# Patient Record
Sex: Female | Born: 1975 | Race: White | Hispanic: No | Marital: Single | Smoking: Former smoker
Health system: Southern US, Community
[De-identification: ages and names within clinical notes are randomized; demographics above are authoritative.]

## PROBLEM LIST (undated history)

## (undated) DIAGNOSIS — E042 Nontoxic multinodular goiter: Secondary | ICD-10-CM

## (undated) DIAGNOSIS — F419 Anxiety disorder, unspecified: Secondary | ICD-10-CM

## (undated) DIAGNOSIS — F988 Other specified behavioral and emotional disorders with onset usually occurring in childhood and adolescence: Secondary | ICD-10-CM

## (undated) HISTORY — DX: Nontoxic multinodular goiter: E04.2

## (undated) HISTORY — DX: Anxiety disorder, unspecified: F41.9

## (undated) HISTORY — DX: Other specified behavioral and emotional disorders with onset usually occurring in childhood and adolescence: F98.8

## (undated) HISTORY — PX: WISDOM TOOTH EXTRACTION: SHX21

---

## 1998-12-03 ENCOUNTER — Inpatient Hospital Stay (HOSPITAL_COMMUNITY): Admission: AD | Admit: 1998-12-03 | Discharge: 1998-12-03 | Payer: Self-pay | Admitting: Obstetrics and Gynecology

## 1998-12-06 ENCOUNTER — Ambulatory Visit (HOSPITAL_COMMUNITY): Admission: RE | Admit: 1998-12-06 | Discharge: 1998-12-06 | Payer: Self-pay | Admitting: Obstetrics and Gynecology

## 1998-12-12 ENCOUNTER — Ambulatory Visit (HOSPITAL_COMMUNITY): Admission: RE | Admit: 1998-12-12 | Discharge: 1998-12-12 | Payer: Self-pay | Admitting: Obstetrics and Gynecology

## 1999-01-10 ENCOUNTER — Encounter: Payer: Self-pay | Admitting: Obstetrics and Gynecology

## 1999-01-10 ENCOUNTER — Ambulatory Visit (HOSPITAL_COMMUNITY): Admission: RE | Admit: 1999-01-10 | Discharge: 1999-01-10 | Payer: Self-pay | Admitting: Obstetrics and Gynecology

## 1999-02-12 ENCOUNTER — Encounter: Payer: Self-pay | Admitting: Obstetrics and Gynecology

## 1999-02-12 ENCOUNTER — Ambulatory Visit (HOSPITAL_COMMUNITY): Admission: RE | Admit: 1999-02-12 | Discharge: 1999-02-12 | Payer: Self-pay | Admitting: Obstetrics and Gynecology

## 1999-03-08 ENCOUNTER — Inpatient Hospital Stay (HOSPITAL_COMMUNITY): Admission: AD | Admit: 1999-03-08 | Discharge: 1999-03-10 | Payer: Self-pay | Admitting: Obstetrics and Gynecology

## 1999-10-14 ENCOUNTER — Other Ambulatory Visit: Admission: RE | Admit: 1999-10-14 | Discharge: 1999-10-14 | Payer: Self-pay | Admitting: Obstetrics and Gynecology

## 2000-10-23 ENCOUNTER — Other Ambulatory Visit: Admission: RE | Admit: 2000-10-23 | Discharge: 2000-10-23 | Payer: Self-pay | Admitting: Obstetrics and Gynecology

## 2003-06-13 ENCOUNTER — Emergency Department (HOSPITAL_COMMUNITY): Admission: EM | Admit: 2003-06-13 | Discharge: 2003-06-13 | Payer: Self-pay | Admitting: Emergency Medicine

## 2003-10-26 ENCOUNTER — Other Ambulatory Visit: Admission: RE | Admit: 2003-10-26 | Discharge: 2003-10-26 | Payer: Self-pay | Admitting: Obstetrics and Gynecology

## 2005-11-07 ENCOUNTER — Other Ambulatory Visit: Admission: RE | Admit: 2005-11-07 | Discharge: 2005-11-07 | Payer: Self-pay | Admitting: Internal Medicine

## 2005-12-10 ENCOUNTER — Other Ambulatory Visit: Admission: RE | Admit: 2005-12-10 | Discharge: 2005-12-10 | Payer: Self-pay | Admitting: Obstetrics and Gynecology

## 2006-08-24 ENCOUNTER — Ambulatory Visit: Payer: Self-pay | Admitting: Family Medicine

## 2006-11-17 ENCOUNTER — Ambulatory Visit: Payer: Self-pay | Admitting: Family Medicine

## 2006-11-18 ENCOUNTER — Encounter: Admission: RE | Admit: 2006-11-18 | Discharge: 2006-11-18 | Payer: Self-pay | Admitting: Family Medicine

## 2006-12-08 HISTORY — PX: OTHER SURGICAL HISTORY: SHX169

## 2007-02-22 ENCOUNTER — Ambulatory Visit: Payer: Self-pay | Admitting: Internal Medicine

## 2007-02-25 ENCOUNTER — Ambulatory Visit: Payer: Self-pay | Admitting: Internal Medicine

## 2007-03-02 ENCOUNTER — Encounter: Payer: Self-pay | Admitting: Internal Medicine

## 2007-03-08 ENCOUNTER — Encounter: Admission: RE | Admit: 2007-03-08 | Discharge: 2007-03-08 | Payer: Self-pay | Admitting: Internal Medicine

## 2007-03-31 ENCOUNTER — Encounter: Payer: Self-pay | Admitting: Internal Medicine

## 2007-03-31 ENCOUNTER — Other Ambulatory Visit: Admission: RE | Admit: 2007-03-31 | Discharge: 2007-03-31 | Payer: Self-pay | Admitting: Interventional Radiology

## 2007-03-31 ENCOUNTER — Encounter (INDEPENDENT_AMBULATORY_CARE_PROVIDER_SITE_OTHER): Payer: Self-pay | Admitting: *Deleted

## 2007-03-31 ENCOUNTER — Encounter: Admission: RE | Admit: 2007-03-31 | Discharge: 2007-03-31 | Payer: Self-pay | Admitting: Internal Medicine

## 2007-11-17 ENCOUNTER — Telehealth (INDEPENDENT_AMBULATORY_CARE_PROVIDER_SITE_OTHER): Payer: Self-pay | Admitting: *Deleted

## 2007-11-18 ENCOUNTER — Ambulatory Visit: Payer: Self-pay | Admitting: Internal Medicine

## 2008-01-07 IMAGING — US US SOFT TISSUE HEAD/NECK
1 series · 13 of 25 positions shown · non-contrast
Comparison: none

CLINICAL DATA: Nodule noted within the left thyroid on physical examination. 
 THYROID ULTRASOUND:
TECHNIQUE: Ultrasound examination of the thyroid gland and adjacent soft tissue structures was performed.

[Series 1: unknown · 0.06mm/px · 13 of 53 slices shown]
[im 1/53]
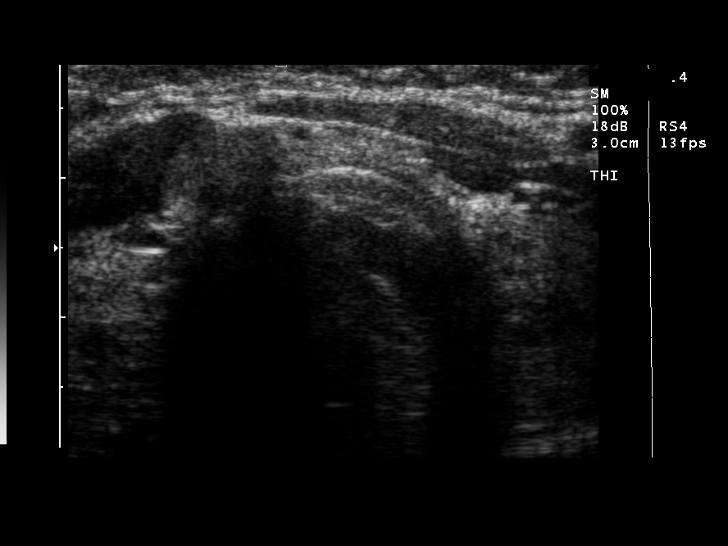
[im 5/53]
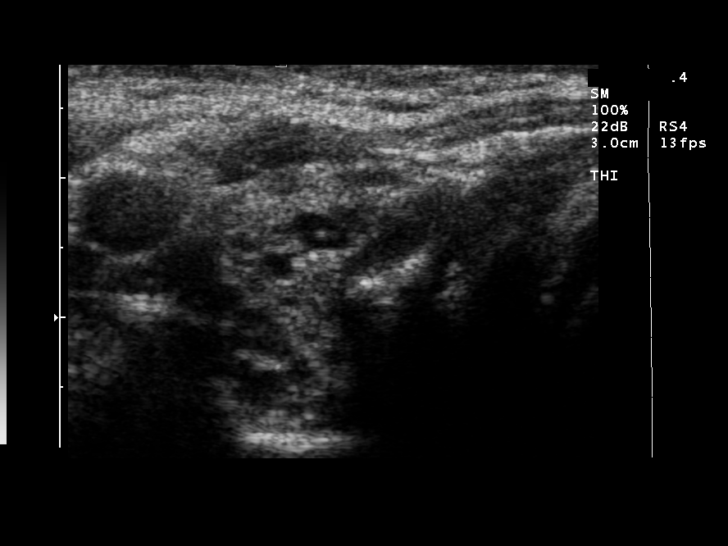
[im 9/53]
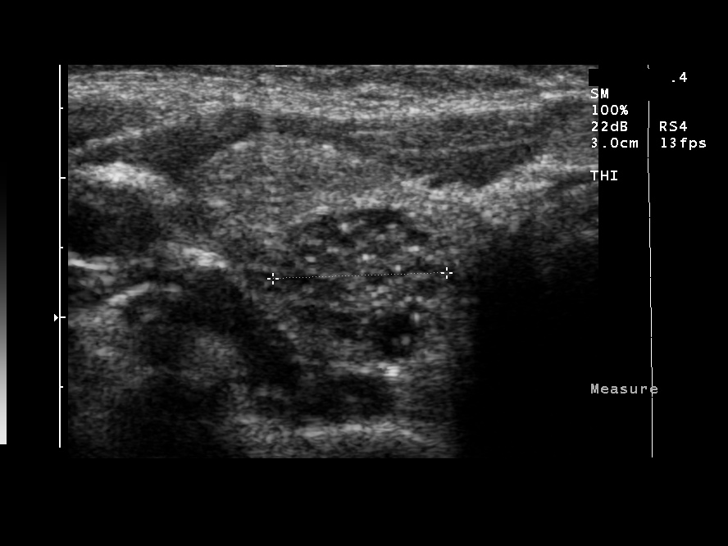
[im 14/53]
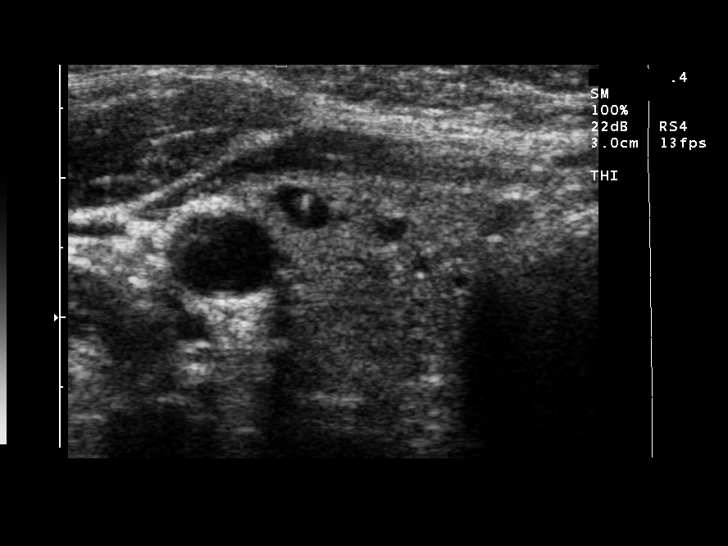
[im 18/53]
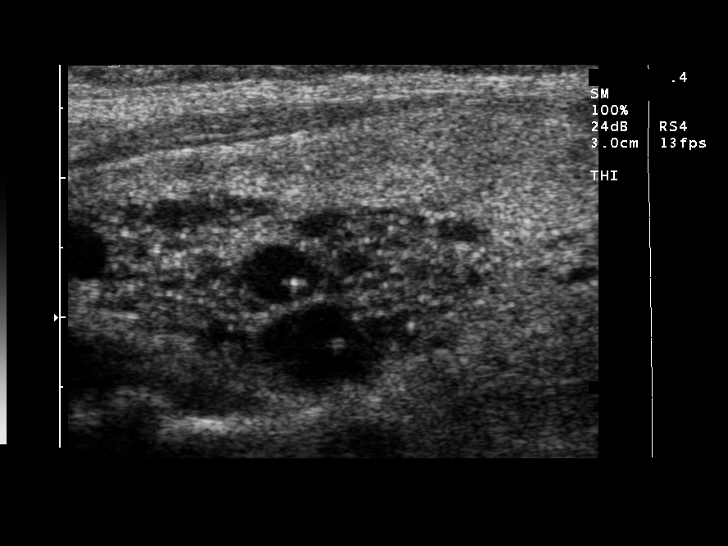
[im 22/53]
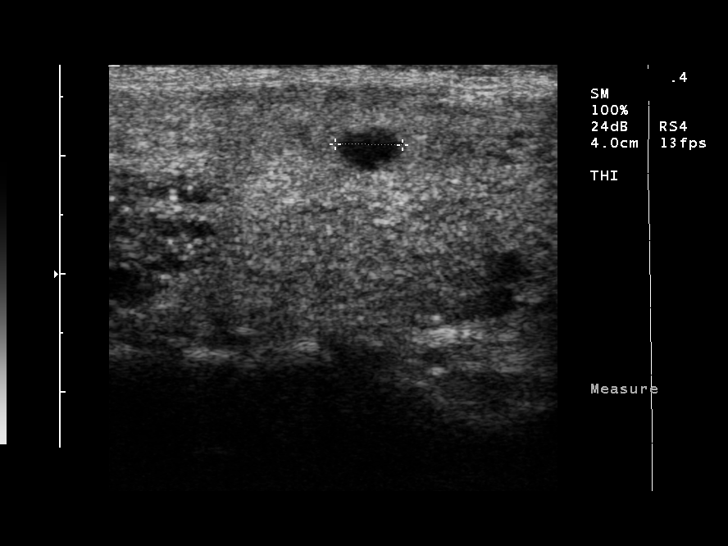
[im 27/53]
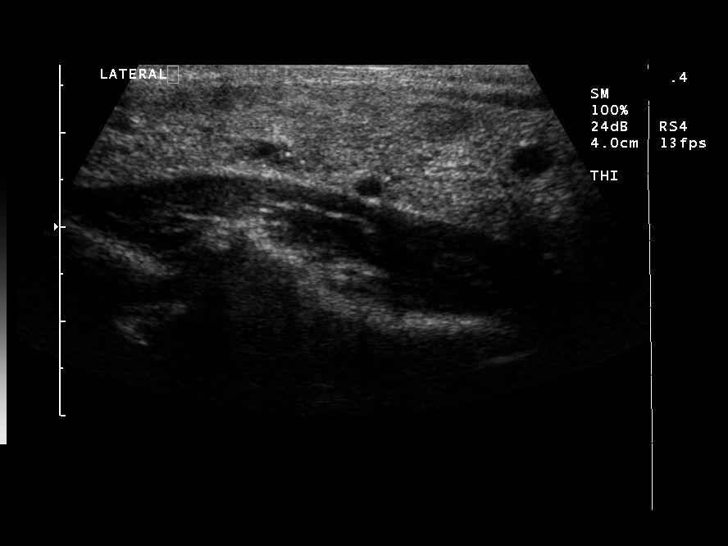
[im 31/53]
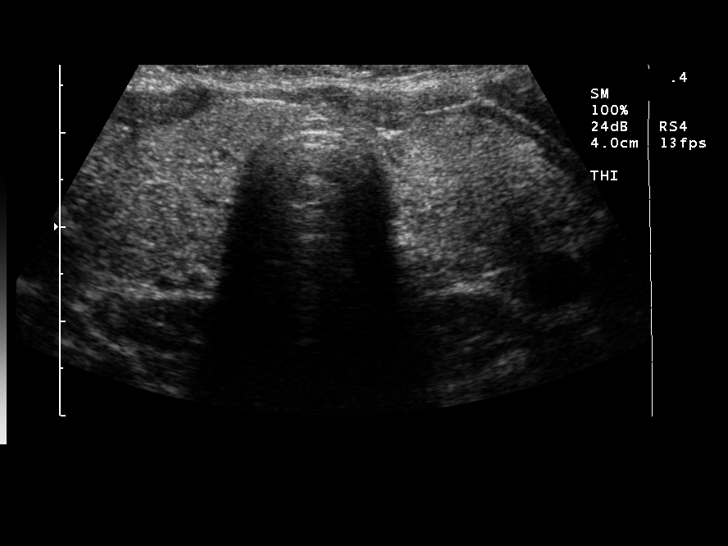
[im 35/53]
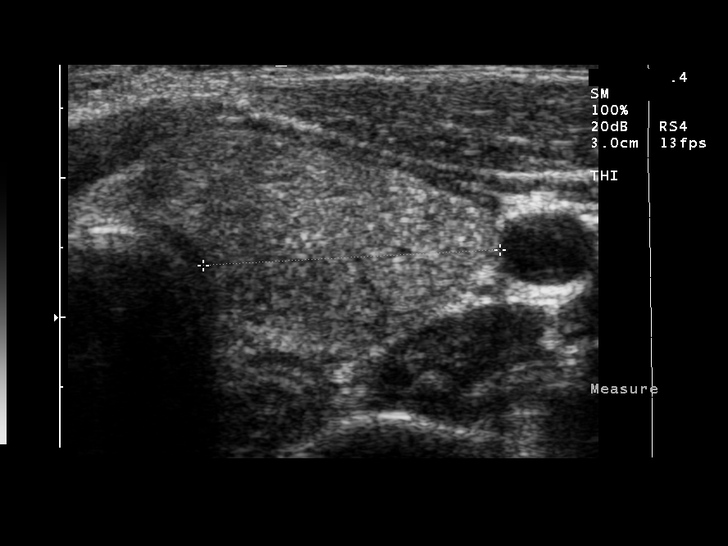
[im 40/53]
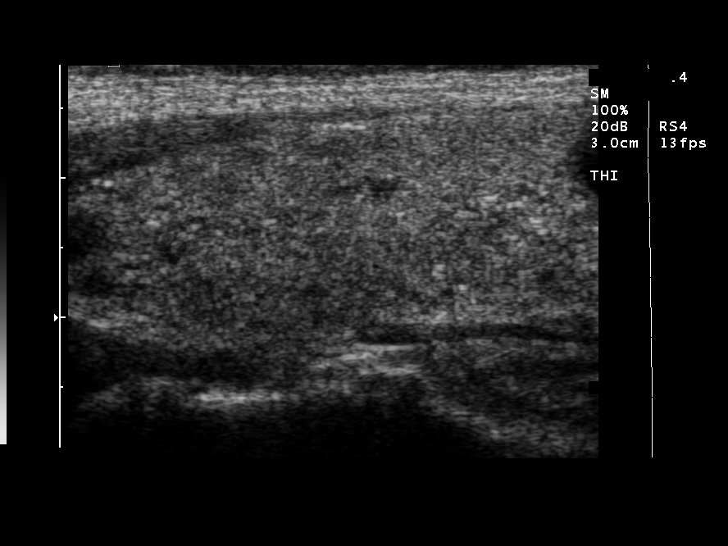
[im 44/53]
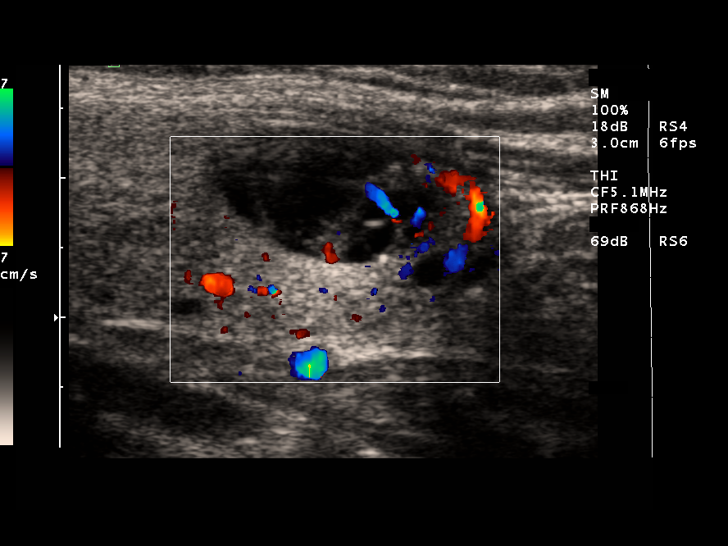
[im 48/53]
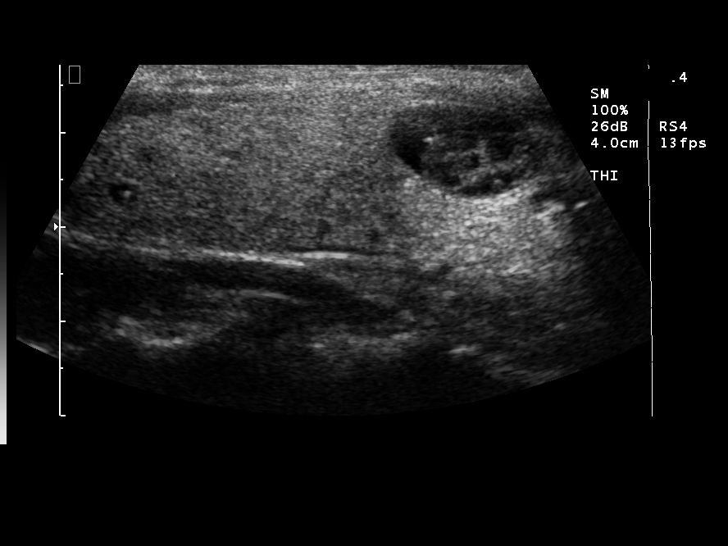
[im 53/53]
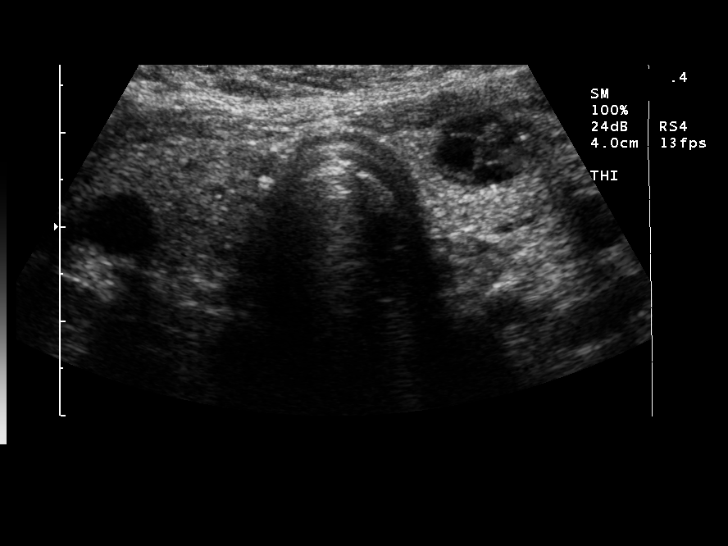

[13 of 25 positions shown; findings below may reference images not displayed]

FINDINGS: The thyroid has an inhomogeneous multinodular texture with the right lobe of thyroid measuring 6.3 x 2.0 x 2.6cm in size and the left lobe measuring 5.6 x 1.8 x 2.1cm in size.  The isthmus measures 3mm in AP dimension.  There are innumerable solid and complex cystic lesions associated with both the right and left lobes of the thyroid.  The largest complex solid/cystic area is located within the right lobe of the thyroid within the upper pole and measures 3.2 x 1.7 x 1.6cm in size.  This contains complex cystic and numerous solid areas and the borders are somewhat ill defined consider tissue sampling of this.  There is a complex predominantly cystic lesion associated with the mid to lower pole portion of the left lobe of the thyroid measuring 1.7 x 1.0 x 1.5cm in size.  Numerous smaller predominantly cystic areas are seen throughout the right and left lobes of the thyroid.
IMPRESSION: Findings are consistent with a multinodular goiter as discussed above.  There is a dominant solid/cystic lesion associated with the upper pole of the right lobe of the thyroid measuring 3.2 x 1.7 x 1.6cm in size.  Consider tissue sampling of this.

## 2008-07-26 ENCOUNTER — Ambulatory Visit: Payer: Self-pay | Admitting: Internal Medicine

## 2008-07-26 DIAGNOSIS — M722 Plantar fascial fibromatosis: Secondary | ICD-10-CM | POA: Insufficient documentation

## 2008-07-26 DIAGNOSIS — E042 Nontoxic multinodular goiter: Secondary | ICD-10-CM | POA: Insufficient documentation

## 2008-07-28 ENCOUNTER — Encounter: Admission: RE | Admit: 2008-07-28 | Discharge: 2008-07-28 | Payer: Self-pay | Admitting: Internal Medicine

## 2008-07-31 ENCOUNTER — Telehealth (INDEPENDENT_AMBULATORY_CARE_PROVIDER_SITE_OTHER): Payer: Self-pay | Admitting: *Deleted

## 2008-07-31 LAB — CONVERTED CEMR LAB
AST: 19 units/L (ref 0–37)
Basophils Absolute: 0 10*3/uL (ref 0.0–0.1)
Basophils Relative: 0 % (ref 0.0–3.0)
Calcium: 9.5 mg/dL (ref 8.4–10.5)
Chloride: 104 meq/L (ref 96–112)
Creatinine, Ser: 1 mg/dL (ref 0.4–1.2)
Direct LDL: 123.4 mg/dL
Eosinophils Absolute: 0.2 10*3/uL (ref 0.0–0.7)
Free T4: 1.1 ng/dL (ref 0.6–1.6)
GFR calc Af Amer: 84 mL/min
GFR calc non Af Amer: 70 mL/min
HDL: 74.1 mg/dL (ref >39.0)
MCHC: 33.6 g/dL (ref 30.0–36.0)
MCV: 84.6 fL (ref 78.0–100.0)
Neutrophils Relative %: 51.1 % (ref 43.0–77.0)
RBC: 5.41 M/uL — ABNORMAL HIGH (ref 3.87–5.11)
T3, Free: 3.3 pg/mL (ref 2.3–4.2)
TSH: 0.89 microintl units/mL (ref 0.35–5.50)
Total Bilirubin: 0.8 mg/dL (ref 0.3–1.2)
Triglycerides: 106 mg/dL (ref 0–149)

## 2008-12-08 HISTORY — PX: OTHER SURGICAL HISTORY: SHX169

## 2009-01-22 ENCOUNTER — Telehealth (INDEPENDENT_AMBULATORY_CARE_PROVIDER_SITE_OTHER): Payer: Self-pay | Admitting: *Deleted

## 2009-02-06 ENCOUNTER — Ambulatory Visit: Payer: Self-pay | Admitting: Internal Medicine

## 2009-02-06 DIAGNOSIS — L723 Sebaceous cyst: Secondary | ICD-10-CM | POA: Insufficient documentation

## 2009-02-10 ENCOUNTER — Encounter: Payer: Self-pay | Admitting: Internal Medicine

## 2009-02-10 LAB — CONVERTED CEMR LAB: TSH: 0.65 microintl units/mL (ref 0.35–5.50)

## 2009-02-15 ENCOUNTER — Encounter (INDEPENDENT_AMBULATORY_CARE_PROVIDER_SITE_OTHER): Payer: Self-pay | Admitting: *Deleted

## 2009-03-13 ENCOUNTER — Encounter: Payer: Self-pay | Admitting: Internal Medicine

## 2009-04-27 ENCOUNTER — Encounter: Payer: Self-pay | Admitting: Internal Medicine

## 2009-05-30 ENCOUNTER — Encounter: Payer: Self-pay | Admitting: Internal Medicine

## 2009-08-17 ENCOUNTER — Ambulatory Visit: Payer: Self-pay | Admitting: Internal Medicine

## 2009-08-21 ENCOUNTER — Encounter (INDEPENDENT_AMBULATORY_CARE_PROVIDER_SITE_OTHER): Payer: Self-pay | Admitting: *Deleted

## 2010-03-30 ENCOUNTER — Emergency Department (HOSPITAL_COMMUNITY): Admission: EM | Admit: 2010-03-30 | Discharge: 2010-03-30 | Payer: Self-pay | Admitting: Emergency Medicine

## 2010-04-25 ENCOUNTER — Ambulatory Visit: Payer: Self-pay | Admitting: Internal Medicine

## 2010-04-25 DIAGNOSIS — J069 Acute upper respiratory infection, unspecified: Secondary | ICD-10-CM | POA: Insufficient documentation

## 2010-04-29 LAB — CONVERTED CEMR LAB
ALT: 13 units/L (ref 0–35)
Albumin: 4.1 g/dL (ref 3.5–5.2)
Alkaline Phosphatase: 46 units/L (ref 39–117)
Basophils Relative: 0.6 % (ref 0.0–3.0)
CO2: 29 meq/L (ref 19–32)
Chloride: 106 meq/L (ref 96–112)
Eosinophils Absolute: 0.2 10*3/uL (ref 0.0–0.7)
Eosinophils Relative: 2.5 % (ref 0.0–5.0)
Hemoglobin: 13.9 g/dL (ref 12.0–15.0)
Lymphocytes Relative: 29.7 % (ref 12.0–46.0)
MCHC: 32.9 g/dL (ref 30.0–36.0)
MCV: 84.3 fL (ref 78.0–100.0)
Monocytes Absolute: 0.7 10*3/uL (ref 0.1–1.0)
Neutro Abs: 3.9 10*3/uL (ref 1.4–7.7)
Potassium: 5.5 meq/L — ABNORMAL HIGH (ref 3.5–5.1)
RBC: 4.99 M/uL (ref 3.87–5.11)
Sodium: 141 meq/L (ref 135–145)
Total CHOL/HDL Ratio: 2
Total Protein: 7 g/dL (ref 6.0–8.3)
WBC: 6.9 10*3/uL (ref 4.5–10.5)

## 2010-05-10 ENCOUNTER — Encounter: Admission: RE | Admit: 2010-05-10 | Discharge: 2010-05-10 | Payer: Self-pay | Admitting: Internal Medicine

## 2010-10-11 ENCOUNTER — Encounter: Payer: Self-pay | Admitting: Internal Medicine

## 2011-01-05 LAB — CONVERTED CEMR LAB
ALT: 15 units/L (ref 0–40)
Alkaline Phosphatase: 52 units/L (ref 39–117)
BUN: 13 mg/dL (ref 6–23)
Bilirubin, Direct: 0.1 mg/dL (ref 0.0–0.3)
Calcium: 9 mg/dL (ref 8.4–10.5)
Eosinophils Absolute: 0.2 10*3/uL (ref 0.0–0.6)
GFR calc Af Amer: 96 mL/min
GFR calc non Af Amer: 79 mL/min
Lymphocytes Relative: 34.5 % (ref 12.0–46.0)
MCV: 81.1 fL (ref 78.0–100.0)
Monocytes Relative: 9.5 % (ref 3.0–11.0)
Neutro Abs: 3.3 10*3/uL (ref 1.4–7.7)
Platelets: 279 10*3/uL (ref 150–400)
T3, Free: 3.3 pg/mL (ref 2.3–4.2)
TSH: 0.85 microintl units/mL (ref 0.35–5.50)

## 2011-01-09 NOTE — Letter (Signed)
Summary: [facility]   Imported By: Lanelle Bal 11/04/2010 13:47:19  _____________________________________________________________________  External Attachment:    Type:   Image     Comment:   External Document

## 2011-01-09 NOTE — Assessment & Plan Note (Signed)
Summary: CPX/NS/KDC   Vital Signs:  Patient profile:   35 year old female Height:      69 inches Weight:      172.6 pounds BMI:     25.58 Temp:     98.7 degrees F oral Pulse rate:   81 / minute Resp:     14 per minute BP sitting:   134 / 92  (left arm) Cuff size:   large  Vitals Entered By: Shonna Chock (Apr 25, 2010 8:27 AM)  Comments REVIEWED MED LIST, PATIENT AGREED DOSE AND INSTRUCTION CORRECT    History of Present Illness: Melanie Castaneda is here for a physical; she does have  active  upper  respiratory tract symptoms(see ROS).  Preventive Screening-Counseling & Management  Alcohol-Tobacco     Smoking Status: current  Caffeine-Diet-Exercise     Does Patient Exercise: yes  Allergies (verified): No Known Drug Allergies  Past History:  Past Medical History: Multinodular goiter ; Pilonidal cyst; Epidermoid Cyst; FH of Dyslipidemia( NMR Lipoprofile  2008: LDL 92 with 1016 total particles/ 487 small dense particles,TG 68, HDL 64. LDL goal = <120).  Past Surgical History: G 1 P 1; Thyroid nodule aspiration 2008;Epidermoid cystectomy ; Wisdom Teeth Extraction  Family History: Father:dyslipidemia,  ? 4cm AAA, Barrett's Esophagus,ADD Mother: colon polyps, abnormal PAP,IC Siblings:sister : Bipolar PGM : lipids, thyroid disease; PGF : lung cancer, thyroid disease, COAD,MI; MGF & MGGM : DM; MGGM : breast cancer; PGGM : breast cancer  Social History: no diet Occupation:Contract Surety Scientist, water quality Married Current Smoker: 1/2 ppd Alcohol use-yes: socially Regular exercise-yes:60-90 min 4X/ week  Review of Systems General:  Complains of chills, fever, and sweats; denies fatigue and sleep disorder. Eyes:  Denies blurring, double vision, and vision loss-both eyes. ENT:  Complains of nasal congestion and sinus pressure; denies difficulty swallowing, ear discharge, earache, and hoarseness; Frontal headache , facial pain & clear secretions. Neti pot used once daily . CV:   Denies chest pain or discomfort, leg cramps with exertion, palpitations, shortness of breath with exertion, swelling of feet, and swelling of hands. Resp:  Denies cough, shortness of breath, sputum productive, and wheezing. GI:  Denies abdominal pain, bloody stools, dark tarry stools, and indigestion. GU:  Denies discharge, dysuria, and hematuria. MS:  Denies joint pain, joint redness, joint swelling, low back pain, mid back pain, and thoracic pain. Derm:  Denies changes in nail beds, dryness, hair loss, and lesion(s). Neuro:  Denies headaches, numbness, and tingling. Psych:  Complains of anxiety, easily angered, easily tearful, and irritability; denies depression. Endo:  Denies cold intolerance, excessive hunger, excessive thirst, excessive urination, and heat intolerance. Heme:  Denies abnormal bruising and bleeding. Allergy:  Denies itching eyes, seasonal allergies, and sneezing.  Physical Exam  General:  well-nourished; alert,appropriate and cooperative throughout examination Head:  Normocephalic and atraumatic without obvious abnormalities.  Eyes:  No corneal or conjunctival inflammation noted.  No lid lag.Large pupils but Perrla. Funduscopic exam benign, without hemorrhages, exudates or papilledema. Ears:  External ear exam shows no significant lesions or deformities.  Otoscopic examination reveals clear canals, tympanic membranes are intact bilaterally without bulging, retraction, inflammation or discharge. Hearing is grossly normal bilaterally. Nose:  External nasal examination shows no deformity or inflammation. Nasal mucosa are pink and moist without lesions or exudates. Mouth:  Oral mucosa and oropharynx without lesions or exudates.  Teeth in good repair. Neck:  No deformities, masses, or tenderness noted. R physiologically larger than L lobe w/o nodules Lungs:  Normal respiratory  effort, chest expands symmetrically. Lungs are clear to auscultation, no crackles or wheezes. Heart:   Normal rate and regular rhythm. S1 and S2 normal without gallop, murmur, click, rub .S4  Abdomen:  Bowel sounds positive,abdomen soft and non-tender without masses, organomegaly or hernias noted. Palpable aorta of 2 cm. Genitalia:  Dr Jackelyn Knife Msk:  No deformity or scoliosis noted of thoracic or lumbar spine.   Pulses:  R and L carotid,radial,dorsalis pedis and posterior tibial pulses are full and equal bilaterally Extremities:  No clubbing, cyanosis, edema, or deformity noted with normal full range of motion of all joints.   Neurologic:  alert & oriented X3 and DTRs symmetrical and normal.   Skin:  Intact without suspicious lesions or rashes. Tatooes LS area Cervical Nodes:  No lymphadenopathy noted Axillary Nodes:  No palpable lymphadenopathy Psych:  memory intact for recent and remote, normally interactive, and good eye contact.     Impression & Recommendations:  Problem # 1:  ROUTINE GENERAL MEDICAL EXAM@[email or web address]  CARE FACL (ICD-V70.0)  Orders: Radiology Referral (Radiology) Venipuncture (04540) TLB-Lipid Panel (80061-LIPID) TLB-BMP (Basic Metabolic Panel-BMET) (80048-METABOL) TLB-CBC Platelet - w/Differential (85025-CBCD) TLB-Hepatic/Liver Function Pnl (80076-HEPATIC) TLB-TSH (Thyroid Stimulating Hormone) (84443-TSH) T- * Misc. Laboratory test [id number])  Problem # 2:  URI (ICD-465.9)  Problem # 3:  NONTOXIC MULTINODULAR GOITER (ICD-241.1)  dominant nodule  biopsy 03/2007  Orders: Radiology Referral (Radiology) TLB-TSH (Thyroid Stimulating Hormone) (84443-TSH) T- * Misc. Laboratory test [id number])  Complete Medication List: 1)  Naproxen 500 Mg  .... As needed only 2)  Klonopin 0.5 Mg Tabs (Clonazepam) .... As needed only 3)  Levothroid 25 Mcg Tabs (Levothyroxine sodium) .Marland Kitchen.. 1 qd 4)  Amoxicillin 500 Mg Caps (Amoxicillin) .Marland Kitchen.. 1 three times a day  Patient Instructions: 1)  Neti pot once daily until sinuses clear.Drink as much fluid as you can tolerate for the next few days.  Fill Rx if secretions purulent. Prescriptions: AMOXICILLIN 500 MG CAPS (AMOXICILLIN) 1 three times a day  #30 x 0   Entered and Authorized by:   Marga Melnick MD   Signed by:   Marga Melnick MD on 04/25/2010   Method used:   Print then Give to Patient   RxID:   [id number] LEVOTHROID 25 MCG TABS (LEVOTHYROXINE SODIUM) 1 qd  #90 Tablet x 3   Entered and Authorized by:   Marga Melnick MD   Signed by:   Marga Melnick MD on 04/25/2010   Method used:   Print then Give to Patient   RxID:   [id number] KLONOPIN 0.5 MG  TABS (CLONAZEPAM) as needed only  #30 x 11   Entered and Authorized by:   Marga Melnick MD   Signed by:   Marga Melnick MD on 04/25/2010   Method used:   Print then Give to Patient   RxID:   [id number]

## 2011-01-09 NOTE — Letter (Signed)
Summary: Cancer Screening/[facility] Personalized Risk Profile  Cancer Screening/[facility] Personalized Risk Profile   Imported By: Lanelle Bal 05/01/2010 09:21:16  _____________________________________________________________________  External Attachment:    Type:   Image     Comment:   External Document

## 2011-03-11 IMAGING — US US SOFT TISSUE HEAD/NECK
1 series · 13 of 25 positions shown · non-contrast
Comparison: 07/28/2008.

CLINICAL DATA: Multinodular goiter.

THYROID ULTRASOUND
TECHNIQUE: Ultrasound examination of the thyroid gland and
adjacent soft tissues was performed.

[Series 1: us soft tissue head/neck · 0.09mm/px · 13 of 78 slices shown]
[im 1/78]
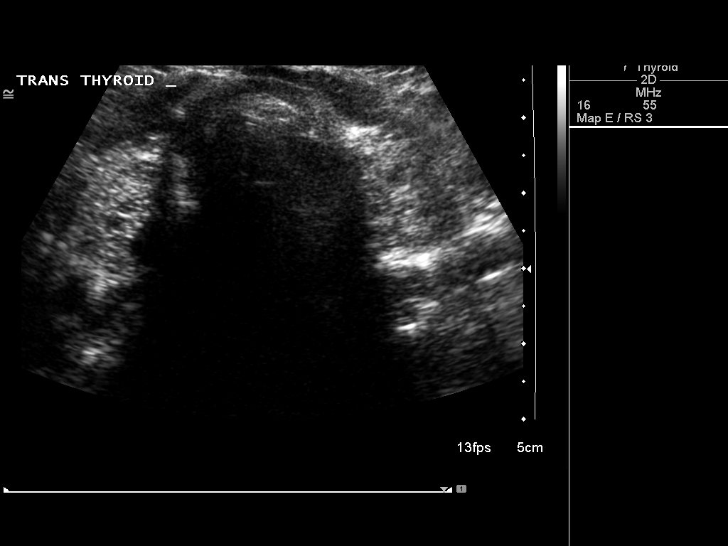
[im 7/78]
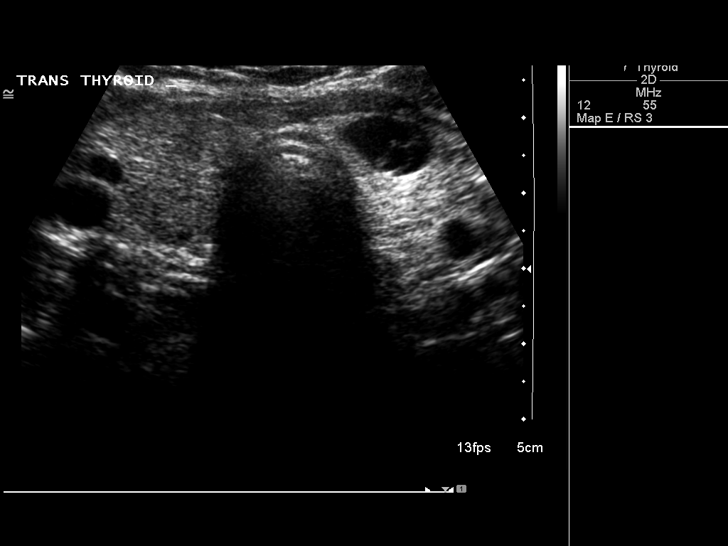
[im 13/78]
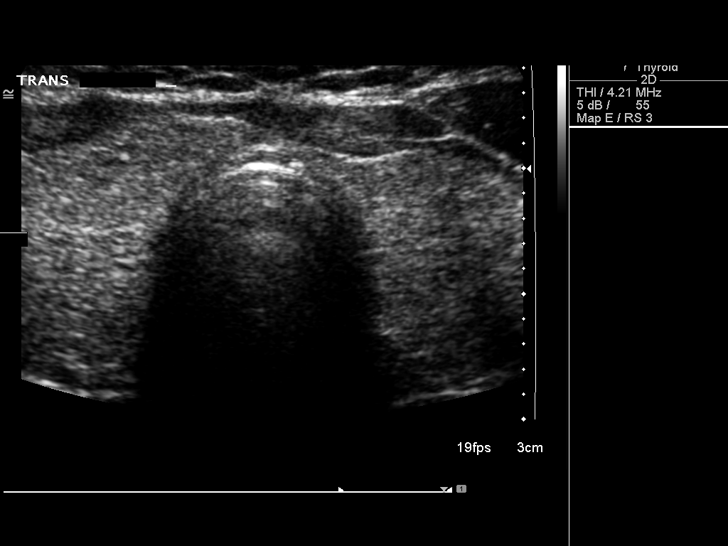
[im 20/78]
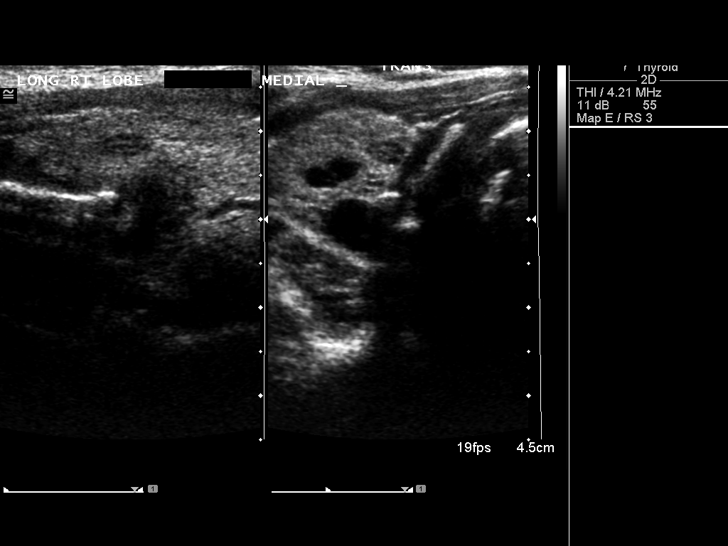
[im 26/78]
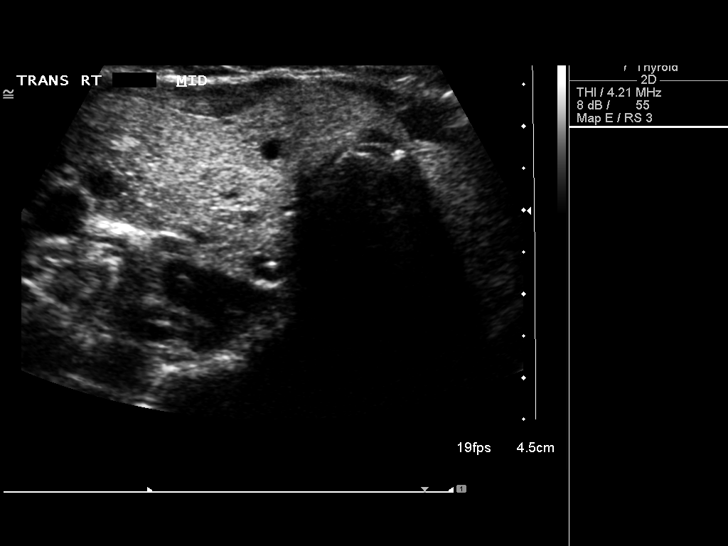
[im 33/78]
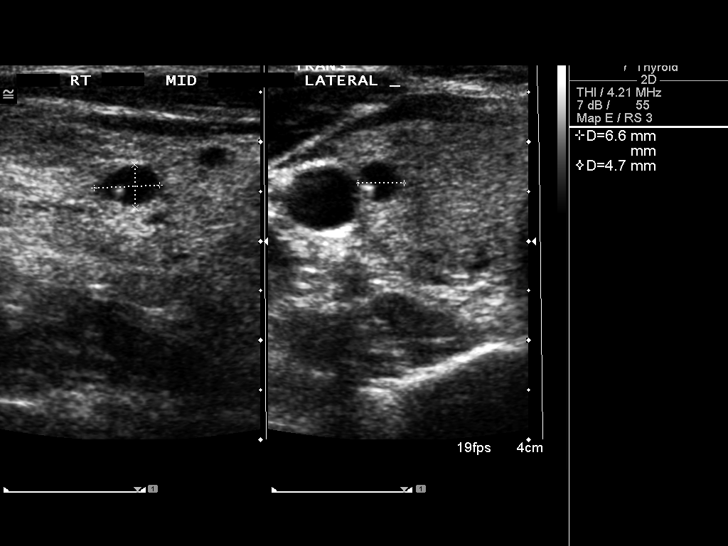
[im 39/78]
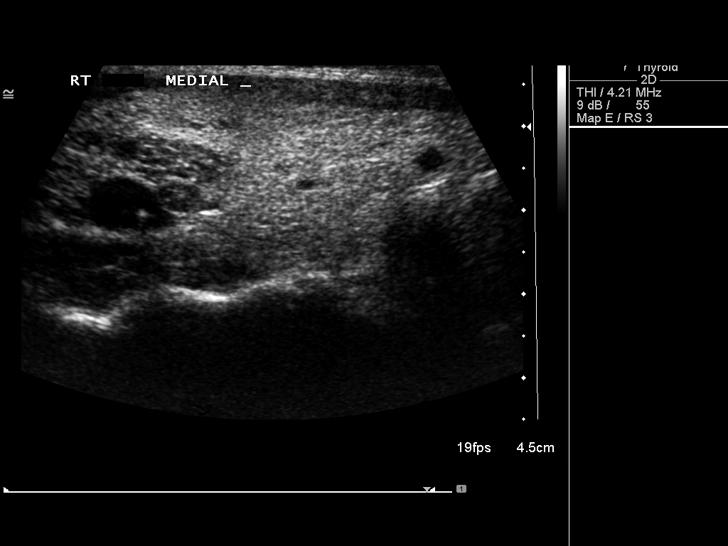
[im 45/78]
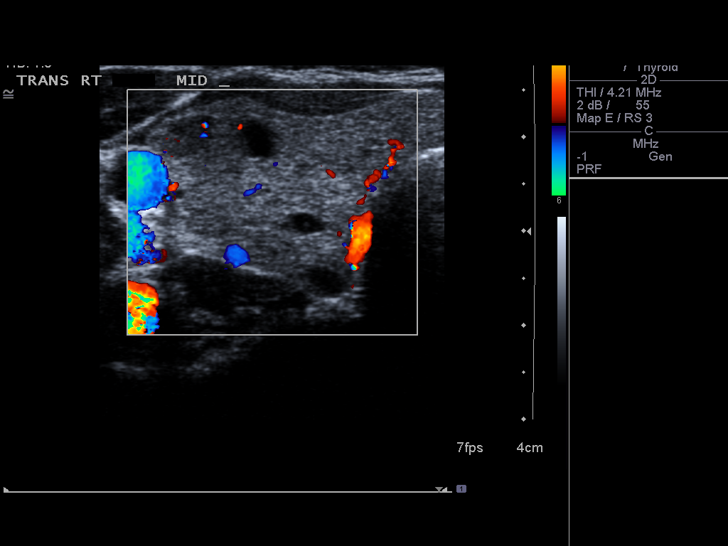
[im 52/78]
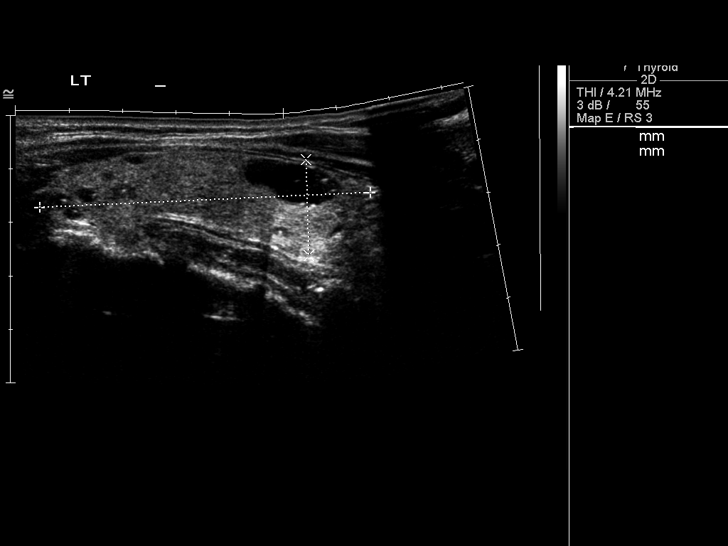
[im 58/78]
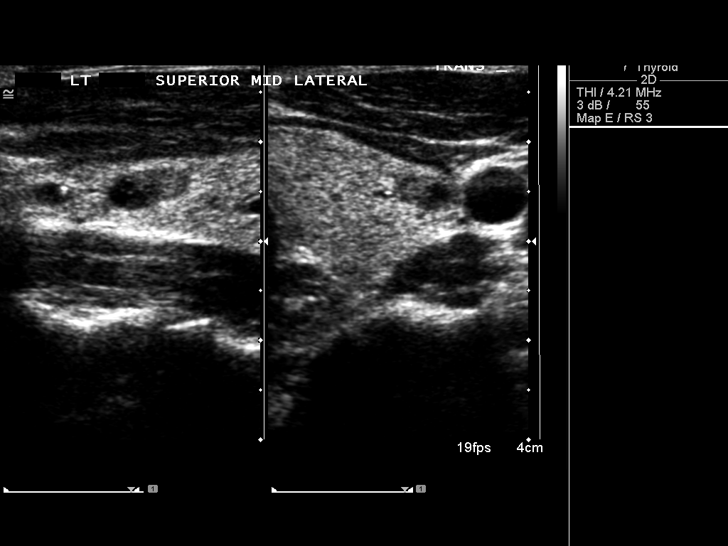
[im 65/78]
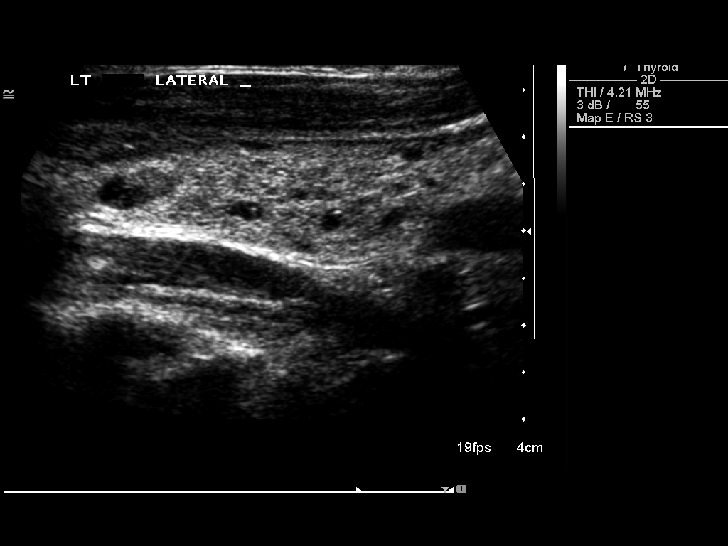
[im 71/78]
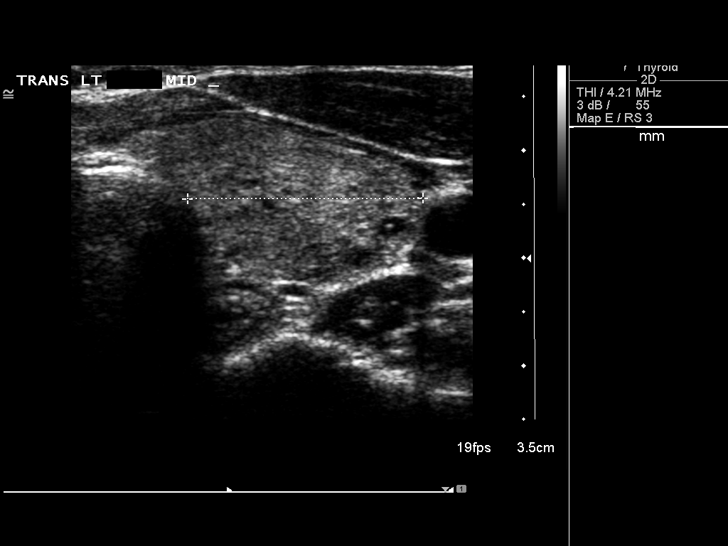
[im 78/78]
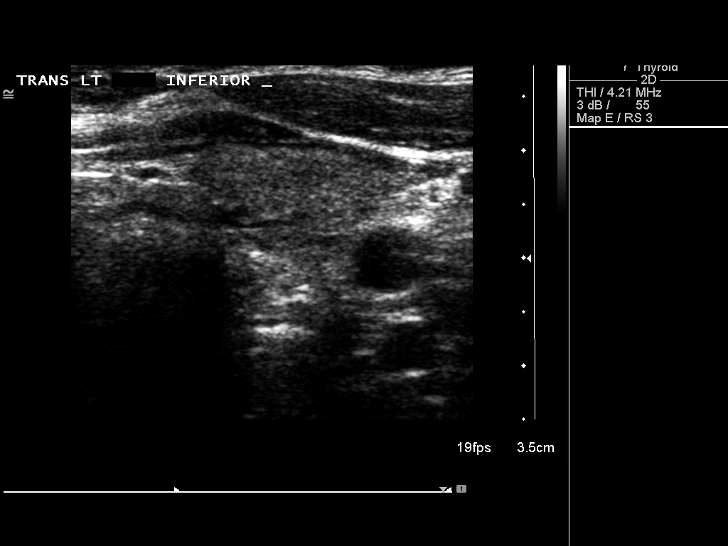

[13 of 25 positions shown; findings below may reference images not displayed]

FINDINGS: The right lobe measures 7.8 x 2.1 x 2.6 cm.  The left
lobe measures 6.2 x 1.8 x 2.2 cm.  The isthmus measures 2 mm.

The echotexture is diffusely heterogeneous.  There are numerous
bilateral thyroid nodules.  There is a dominant heterogeneous 3.5 x
1.6 x 1.7 cm nodule in the right thyroid lobe superiorly with
scattered microcalcifications.  There are numerous sub centimeter
nodules and cysts throughout the remainder the right lobe. The
dominant lesion in the right lobe has been biopsied previously and
there is only slightly larger when compared to the examination from
03/08/2007.

The dominant lesion in the left lobe is located inferiorly and
measures 1.8 x 1.0 x 1.4 cm.  This is a heterogeneous nodule with
tiny calcifications.  There are multiple other small nodules in the
left lobe which are less than a centimeter in size.
IMPRESSION: 1.  Enlarging multinodular thyroid goiter.
2.  Bilateral thyroid nodules not significantly changed.  Recommend
continued sonographic surveillance.

## 2011-04-28 ENCOUNTER — Encounter: Payer: Self-pay | Admitting: Internal Medicine

## 2011-04-28 ENCOUNTER — Ambulatory Visit (INDEPENDENT_AMBULATORY_CARE_PROVIDER_SITE_OTHER): Payer: 59 | Admitting: Internal Medicine

## 2011-04-28 VITALS — BP 114/70 | HR 72 | Temp 98.6°F | Resp 14 | Ht 69.75 in | Wt 182.0 lb

## 2011-04-28 DIAGNOSIS — E042 Nontoxic multinodular goiter: Secondary | ICD-10-CM

## 2011-04-28 DIAGNOSIS — R490 Dysphonia: Secondary | ICD-10-CM

## 2011-04-28 DIAGNOSIS — Z0000 Encounter for general adult medical examination without abnormal findings: Secondary | ICD-10-CM

## 2011-04-28 LAB — T4, FREE: Free T4: 0.92 ng/dL (ref 0.60–1.60)

## 2011-04-28 LAB — CBC WITH DIFFERENTIAL/PLATELET
Basophils Absolute: 0.1 10*3/uL (ref 0.0–0.1)
Eosinophils Absolute: 0.2 10*3/uL (ref 0.0–0.7)
Hemoglobin: 12.3 g/dL (ref 12.0–15.0)
Lymphocytes Relative: 27.7 % (ref 12.0–46.0)
MCHC: 32.9 g/dL (ref 30.0–36.0)
Monocytes Relative: 7.6 % (ref 3.0–12.0)
Neutro Abs: 4.8 10*3/uL (ref 1.4–7.7)
Neutrophils Relative %: 61.9 % (ref 43.0–77.0)
Platelets: 280 10*3/uL (ref 150.0–400.0)
RDW: 15.7 % — ABNORMAL HIGH (ref 11.5–14.6)

## 2011-04-28 LAB — HEPATIC FUNCTION PANEL
ALT: 13 U/L (ref 0–35)
AST: 18 U/L (ref 0–37)
Albumin: 3.8 g/dL (ref 3.5–5.2)
Alkaline Phosphatase: 47 U/L (ref 39–117)
Total Protein: 6.7 g/dL (ref 6.0–8.3)

## 2011-04-28 LAB — TSH: TSH: 0.39 u[IU]/mL (ref 0.35–5.50)

## 2011-04-28 LAB — LIPID PANEL
Cholesterol: 196 mg/dL (ref 0–200)
HDL: 85.5 mg/dL (ref >39.00)
Triglycerides: 106 mg/dL (ref 0.0–149.0)
VLDL: 21.2 mg/dL (ref 0.0–40.0)

## 2011-04-28 LAB — BASIC METABOLIC PANEL
CO2: 27 mEq/L (ref 19–32)
Calcium: 9.3 mg/dL (ref 8.4–10.5)
Creatinine, Ser: 0.8 mg/dL (ref 0.4–1.2)
GFR: 89.33 mL/min (ref >60.00)
Glucose, Bld: 84 mg/dL (ref 70–99)
Sodium: 136 mEq/L (ref 135–145)

## 2011-04-28 MED ORDER — CLONAZEPAM 0.5 MG PO TABS: 0.5000 mg | ORAL_TABLET | Freq: Every evening | ORAL | Status: DC | PRN

## 2011-04-28 NOTE — Progress Notes (Signed)
Subjective:    Patient ID: Melanie Castaneda, female    DOB: 1976-03-26, 35 y.o.   MRN: [id number]  HPI She is here for an annual exam; she questions thyroid status due to  weight gain of 10# in past year. Thyroid function monitor  Medications status(change in dose/brand/mode of administration):no Constitutional: Fatigue: increased; Sleep pattern:normal; Appetite:good  Visual change(blurred/diplopia/visual loss):no Hoarseness:intermittently , ?from allergies; Swallowing issues:no Cardiovascular: Palpitations:occasionally; Racing:no; Irregularity:no GI: Constipation:no; Diarrhea:no Derm: Change in nails/hair/skin:no Neuro: Numbness/tingling:no; Tremor:no Psych: Anxiety:some, stable; Depression:no; Panic attacks:no Endo: Temperature intolerance: Heat:no; Cold:yes    Review of Systems Patient reports no significant  hearing  changes, adenopathy,fever, swallowing issues, chest pain,edema,persistant /recurrent cough, hemoptysis, dyspnea( rest/ exertional/paroxysmal nocturnal), gastrointestinal bleeding(melena, rectal bleeding), abdominal pain, significant heartburn,  GU symptoms(dysuria, hematuria,pyuria, incontinence)  Gyn symptoms(abnormal  bleeding , pain),  syncope, focal weakness, memory loss,abnormal bruising or bleeding,or musculoskeletal symptoms. No treatment to date for itchy throat.     Objective:   Physical Exam Gen.: Healthy and well-nourished in appearance. Alert, appropriate and cooperative throughout exam. Head: Normocephalic without obvious abnormalities;  no alopecia  Eyes: No corneal or conjunctival inflammation noted. Pupils equal round reactive to light and accommodation. Fundal exam is benign without hemorrhages, exudate, papilledema. Extraocular motion intact. Vision grossly normal. Ears: External  ear exam reveals no significant lesions or deformities. Canals clear .TMs normal. Hearing is grossly normal bilaterally. Nose: External nasal exam reveals no deformity or  inflammation. Nasal mucosa are pink and moist. No lesions or exudates noted. Septum no deviation  Mouth: Oral mucosa and oropharynx reveal no lesions or exudates. Teeth in good repair. Neck: No deformities, masses, or tenderness noted. Range of motion normal. Thyroid : physiologic asymmetry , R > L. Lungs: Normal respiratory effort; chest expands symmetrically. Lungs are clear to auscultation without rales, wheezes, or increased work of breathing. Heart: Normal rate and rhythm. Normal S1 and S2. No gallop, click, or rub. No  murmur. Abdomen: Bowel sounds normal; abdomen soft and nontender. No masses, organomegaly or hernias noted. Genitalia: Dr Jackelyn Knife, [place] [facility].                                                                                                                       Musculoskeletal/extremities: No deformity or scoliosis noted of  the thoracic or lumbar spine. No clubbing, cyanosis, edema, or deformity noted. Range of motion  normal .Tone & strength  normal.Joints normal. Nail health good; no onycholysis. Crepitus of knees Vascular: Carotid, radial artery, dorsalis pedis and dorsalis posterior tibial pulses are full and equal. No bruits present. Neurologic: Alert and oriented x3. Deep tendon reflexes symmetrical and normal.  No tremor.       Skin: Intact without suspicious lesions or rashes. tatoo  LS area Lymph: No cervical, axillary, or inguinal lymphadenopathy present. Psych: Mood and affect are normal. Normally interactive  Assessment & Plan:  #1 comprehensive physical exam; no acute issues  #2 multinodular nontoxic goiter  #3 hoarseness, ?possible extrinsic etiology.  Plan: See orders and recommendations.

## 2011-04-28 NOTE — Patient Instructions (Signed)
Preventive Health Care: Exercise  30-45  minutes a day, 3-4 days a week. Walking is especially valuable in preventing Osteoporosis. Eat a low-fat diet with lots of fruits and vegetables, up to 7-9 servings per day. Avoid obesity; your goal = waist less than 35 inches.Consume less than 30 grams of sugar per day from foods & drinks with High Fructose Corn Syrup as #2,3 or #4 on label. Eye Doctor - have an eye exam @ least annually Please use Allegra daily as needed for the hoarseness. Report any fever or purulent secretions which would suggest superimposed infection.

## 2011-04-28 NOTE — Assessment & Plan Note (Signed)
resected by Dr Gerrit Friends 2010

## 2011-05-02 ENCOUNTER — Other Ambulatory Visit (HOSPITAL_COMMUNITY): Payer: 59

## 2011-05-06 ENCOUNTER — Ambulatory Visit (HOSPITAL_COMMUNITY)
Admission: RE | Admit: 2011-05-06 | Discharge: 2011-05-06 | Disposition: A | Discharge status: Home / Self Care | Payer: 59 | Source: Ambulatory Visit | Attending: Internal Medicine | Admitting: Internal Medicine

## 2011-05-06 DIAGNOSIS — E042 Nontoxic multinodular goiter: Secondary | ICD-10-CM

## 2011-05-06 DIAGNOSIS — Z09 Encounter for follow-up examination after completed treatment for conditions other than malignant neoplasm: Secondary | ICD-10-CM | POA: Insufficient documentation

## 2011-07-01 ENCOUNTER — Other Ambulatory Visit: Payer: Self-pay | Admitting: Internal Medicine

## 2012-01-06 ENCOUNTER — Encounter: Payer: Self-pay | Admitting: Internal Medicine

## 2012-01-06 ENCOUNTER — Ambulatory Visit (INDEPENDENT_AMBULATORY_CARE_PROVIDER_SITE_OTHER): Payer: Managed Care, Other (non HMO) | Admitting: Internal Medicine

## 2012-01-06 VITALS — BP 122/80 | HR 75 | Temp 98.1°F | Wt 188.2 lb

## 2012-01-06 DIAGNOSIS — L309 Dermatitis, unspecified: Secondary | ICD-10-CM

## 2012-01-06 DIAGNOSIS — L259 Unspecified contact dermatitis, unspecified cause: Secondary | ICD-10-CM

## 2012-01-06 DIAGNOSIS — E042 Nontoxic multinodular goiter: Secondary | ICD-10-CM

## 2012-01-06 NOTE — Progress Notes (Signed)
  Subjective:    Patient ID: Melanie Castaneda, female    DOB: Sep 14, 1976, 36 y.o.   MRN: [id number]  HPI EYE ISSUE: Onset:2+ weeks Location: both lids Character:redness then scaling  Course: both improved   Self-treated with: hot compresses             Improvement with treatment:yesbut  residual edema History Pruritis: no  Tenderness: no  New medications/antibiotics: no  New detergent, new clothing, or other topical exposure: no   Red Flags Feeling ill: no  Fever: no  Mouth lesions: no  Facial/tongue swelling/difficulty breathing:  no   Past medical history: Negative for eczema , Rosacea or other chronic skin conditions.    Review of Systems she denies any visual dysfunction such as blurring, diplopia, or loss of vision. She's had no purulence from the eyes.     Objective:   Physical Exam General appearance:good health ;well nourished; no acute distress or increased work of breathing is present.  No  lymphadenopathy about the head, neck, or axilla noted.   Eyes: No conjunctival inflammation  is present. Extraocular motion is intact. Vision is normal. There is very faint erythema in a linear distribution over the upper lids. Clinically I cannot appreciate significant edema.  Ears:  External ear exam shows no significant lesions or deformities.  Otoscopic examination reveals clear canals, tympanic membranes are intact bilaterally without bulging, retraction, inflammation or discharge.  Nose:  External nasal examination shows no deformity or inflammation. Nasal mucosa are pink and moist without lesions or exudates. No septal dislocation or deviation.No obstruction to airflow.   Oral exam: Dental hygiene is good; lips and gums are healthy appearing.There is no oropharyngeal erythema or exudate noted.    Skin: Warm & dry w/o rashes or lesions         Assessment & Plan:  #1 lid edema and minimal erythema; contact dermatitis suggested. There is no typical rosacea-type  appearance.  There is no evidence of any systemic illness or active conjunctivitis.  Plan: Avoidance of all contact chemicals or cosmetics to lids was emphasized. Hypoallergenic cleansing product would be recommended as well. Cortaid twice a day as needed is recommended. She should avoid getting this into the conjunctiva themselves.

## 2012-01-06 NOTE — Patient Instructions (Signed)
Apply Cort Aid OTC twice a day to the involved tissues. Do not get this topical steroid into eyes. Use hypoallergenic cleansing motions. 

## 2012-01-06 NOTE — Assessment & Plan Note (Signed)
TSH was 0.39 in may 2012; this should be rechecked.

## 2012-04-16 ENCOUNTER — Other Ambulatory Visit: Payer: Self-pay | Admitting: Internal Medicine

## 2012-05-27 ENCOUNTER — Encounter: Payer: Self-pay | Admitting: Internal Medicine

## 2012-05-27 ENCOUNTER — Ambulatory Visit (INDEPENDENT_AMBULATORY_CARE_PROVIDER_SITE_OTHER): Payer: Managed Care, Other (non HMO) | Admitting: Internal Medicine

## 2012-05-27 VITALS — BP 118/74 | HR 77 | Temp 98.1°F | Resp 12 | Ht 69.03 in | Wt 175.6 lb

## 2012-05-27 DIAGNOSIS — E042 Nontoxic multinodular goiter: Secondary | ICD-10-CM

## 2012-05-27 DIAGNOSIS — Z0000 Encounter for general adult medical examination without abnormal findings: Secondary | ICD-10-CM

## 2012-05-27 LAB — BASIC METABOLIC PANEL WITH GFR
BUN: 11 mg/dL (ref 6–23)
CO2: 26 meq/L (ref 19–32)
Calcium: 8.8 mg/dL (ref 8.4–10.5)
Chloride: 107 meq/L (ref 96–112)
Creatinine, Ser: 0.8 mg/dL (ref 0.4–1.2)
GFR: 82.67 mL/min
Glucose, Bld: 87 mg/dL (ref 70–99)
Potassium: 4 meq/L (ref 3.5–5.1)
Sodium: 139 meq/L (ref 135–145)

## 2012-05-27 LAB — LIPID PANEL
Cholesterol: 177 mg/dL (ref 0–200)
HDL: 78.7 mg/dL
LDL Cholesterol: 87 mg/dL (ref 0–99)
Total CHOL/HDL Ratio: 2
Triglycerides: 55 mg/dL (ref 0.0–149.0)
VLDL: 11 mg/dL (ref 0.0–40.0)

## 2012-05-27 LAB — HEPATIC FUNCTION PANEL
Albumin: 3.8 g/dL (ref 3.5–5.2)
Alkaline Phosphatase: 49 U/L (ref 39–117)
Total Protein: 7 g/dL (ref 6.0–8.3)

## 2012-05-27 LAB — TSH: TSH: 0.55 u[IU]/mL (ref 0.35–5.50)

## 2012-05-27 MED ORDER — CLONAZEPAM 0.5 MG PO TABS: 0.5000 mg | ORAL_TABLET | Freq: Every evening | ORAL | Status: DC | PRN

## 2012-05-27 MED ORDER — NAPROXEN 500 MG PO TABS: 500.0000 mg | ORAL_TABLET | ORAL | Status: DC | PRN

## 2012-05-27 NOTE — Patient Instructions (Addendum)
Preventive Health Care: Exercise  30-45  minutes a day, 3-4 days a week. Walking is especially valuable in preventing Osteoporosis. Eat a low-fat diet with lots of fruits and vegetables, up to 7-9 servings per day. Consume less than 30 grams of sugar per day from foods & drinks with High Fructose Corn Syrup as # 1,2,3 or #4 on label. Health Care Power of Attorney & Living Will place you in charge of your health care  decisions. Verify these are  in place. Please try to go on My Chart within the next 24 hours to allow me to release the results directly to you.

## 2012-05-27 NOTE — Progress Notes (Signed)
Subjective:    Patient ID: Melanie Castaneda, female    DOB: [birth year], 36 y.o.   MRN: [id number]  HPI  Mrs. Hill is here for a physical;acute issues include borderline iron deficiency noted at her gynecologist office. Hematocrit was 35.3; MCH was 26.2, slightly decreased. Daily multivitamin with iron was prescribed. Menses are not excessively heavy. She denies dysphagia, abdominal pain, melena,or  rectal bleeding.      Review of Systems Thyroid function monitor  Medications status(change in dose/brand/mode of administration):no Constitutional: Weight change: 10 # loss of purpose ( Insanity CVE); Fatigue:no; Sleep pattern:no; Appetite:yes  Visual change(blurred/diplopia/visual loss):no Hoarseness:no; Swallowing issues:no Cardiovascular: Palpitations:rare; Racing:no; Irregularity:no GI: Constipation:no; Diarrhea:no Derm: Change in nails/hair/skin:no Neuro: Numbness/tingling:no; Tremor:no Psych: Anxiety:no; Depression:no; Panic attacks:no Endo: Temperature intolerance: Heat:no; Cold:yes       Objective:   Physical Exam Gen.: Healthy and well-nourished in appearance. Alert, appropriate and cooperative throughout exam. Head: Normocephalic without obvious abnormalities  Eyes: No corneal or conjunctival inflammation noted. Pupils equal round reactive to light and accommodation. Fundal exam is benign without hemorrhages, exudate, papilledema. Extraocular motion intact.  No lid lag present.Vision grossly normal. Ears: External  ear exam reveals no significant lesions or deformities. Canals clear .TMs normal. Hearing is grossly normal bilaterally. Nose: External nasal exam reveals no deformity or inflammation. Nasal mucosa are pink and moist. No lesions or exudates noted.  Mouth: Oral mucosa and oropharynx reveal no lesions or exudates. Teeth in good repair. Neck: No deformities, masses, or tenderness noted. Range of motion normal. Thyroid : Physiologic asymmetry. Right lobe is slightly larger  than left. No palpable nodules. Lungs: Normal respiratory effort; chest expands symmetrically. Lungs are clear to auscultation without rales, wheezes, or increased work of breathing. Heart: Normal rate and rhythm. Normal S1 and S2. No gallop, click, or rub. Soft  S4 without murmur. Abdomen: Bowel sounds normal; abdomen soft and nontender. No masses, organomegaly or hernias noted. Genitalia: Dr Jackelyn Knife  .                                                                                   Musculoskeletal/extremities: No deformity or scoliosis noted of  the thoracic or lumbar spine. No clubbing, cyanosis, edema, or deformity noted. Range of motion  normal .Tone & strength  normal.Joints normal. Nail health  good. Vascular: Carotid, radial artery, dorsalis pedis and  posterior tibial pulses are full and equal. No bruits present. Neurologic: Alert and oriented x3. Deep tendon reflexes symmetrical and normal.          Skin: Intact without suspicious lesions or rashes. Well-healed biopsy site mid back. Tattoos lumbosacral area Lymph: No cervical, axillary lymphadenopathy present. Psych: Mood and affect are normal. Normally interactive  Assessment & Plan:  #1 comprehensive physical exam; no acute findings #2 see Problem List with Assessments & Recommendations Plan: see Orders

## 2012-06-04 ENCOUNTER — Ambulatory Visit (HOSPITAL_BASED_OUTPATIENT_CLINIC_OR_DEPARTMENT_OTHER)
Admission: RE | Admit: 2012-06-04 | Discharge: 2012-06-04 | Disposition: A | Discharge status: Home / Self Care | Payer: Managed Care, Other (non HMO) | Source: Ambulatory Visit | Attending: Internal Medicine | Admitting: Internal Medicine

## 2012-06-04 ENCOUNTER — Other Ambulatory Visit (HOSPITAL_BASED_OUTPATIENT_CLINIC_OR_DEPARTMENT_OTHER): Payer: Managed Care, Other (non HMO)

## 2012-06-04 DIAGNOSIS — E042 Nontoxic multinodular goiter: Secondary | ICD-10-CM | POA: Insufficient documentation

## 2012-09-10 ENCOUNTER — Ambulatory Visit (INDEPENDENT_AMBULATORY_CARE_PROVIDER_SITE_OTHER): Payer: Managed Care, Other (non HMO) | Admitting: Internal Medicine

## 2012-09-10 ENCOUNTER — Encounter: Payer: Self-pay | Admitting: Internal Medicine

## 2012-09-10 VITALS — BP 116/78 | HR 86 | Wt 171.1 lb

## 2012-09-10 DIAGNOSIS — F39 Unspecified mood [affective] disorder: Secondary | ICD-10-CM

## 2012-09-10 DIAGNOSIS — R4586 Emotional lability: Secondary | ICD-10-CM

## 2012-09-10 DIAGNOSIS — E042 Nontoxic multinodular goiter: Secondary | ICD-10-CM

## 2012-09-10 LAB — TSH: TSH: 0.52 u[IU]/mL (ref 0.35–5.50)

## 2012-09-10 MED ORDER — CITALOPRAM HYDROBROMIDE 20 MG PO TABS: 20.0000 mg | ORAL_TABLET | Freq: Every day | ORAL | Status: DC

## 2012-09-10 NOTE — Progress Notes (Signed)
  Subjective:    Patient ID: Melanie Castaneda, female    DOB: [birth year], 36 y.o.   MRN: [id number]  HPI    She describes increased irritability for at least 4-6 weeks; she denies any specific trigger. She also is concerned about mood swings. She specifically denies any panic attacks but describes some possible component of depression.  She exercises 4-5 times a week up to one hour. This had been able to control mood swings and irritability in the past.  There is a family history of ADD in her father & a sister; her sister has bipolar disorder.  She is on thyroid replacement; her TSHs have ranged from 0.39-0.67 over the last 3 years.      Review of Systems   Constitutional: Weight change: no; Fatigue:no; Sleep pattern:good; Appetite:good  Visual change(blurred/diplopia/visual loss):no Hoarseness:no; Swallowing issues:no Cardiovascular: Palpitations:no; Racing & irregularity:occasionally @ rest , not with exertion GI: Constipation:no; Diarrhea:no Derm: Change in nails/hair/skin:no Neuro: Numbness/tingling:no; Tremor:no Endo: Temperature intolerance: Heat:no; Cold:yes      Objective:   Physical Exam  Gen.: Healthy &  well-nourished; in no acute distress Eyes: Extraocular motion intact; no lid lag or proptosis Heart: Normal rhythm and rate without significant murmur, gallop, or extra heart sounds Lungs: Chest clear to auscultation without rales,rales, wheezes Neuro:Deep tendon reflexes are equal and within normal limits; minor R hand tremor  Skin: Warm and dry without significant lesions or rashes; no onycholysis Psych: Normally communicative and interactive; no abnormal mood or affect clinically.   MDQ: 4 + answers        Assessment & Plan:  #1 mood swings #2 FH bipolar disorder & ADD #3 hypothyroidism Plan: See orders and recommendations

## 2012-09-10 NOTE — Patient Instructions (Addendum)
If you activate My Chart; the results can be released to you as soon as they populate from the lab. If you choose not to use this program; the labs have to be reviewed, copied & mailed   causing a delay in getting the results to you. 

## 2013-02-24 ENCOUNTER — Other Ambulatory Visit: Payer: Self-pay | Admitting: Internal Medicine

## 2013-02-24 NOTE — Telephone Encounter (Signed)
Left message on VM for patient to return call when available. Reason for call (not left on VM) 1.) Patient will need to stop by the office to sign contract in order to receive rx for Clonazepam.  2.) Patient due for appointment in June 2014- should schedule soon

## 2013-02-24 NOTE — Telephone Encounter (Signed)
ALSO REFILL  CLONAZEPAM 0.5 MG TABLET TAKE 1 BY MOUTH AT BEDTIME AS NEEDED

## 2013-02-25 MED ORDER — CLONAZEPAM 0.5 MG PO TABS: 0.5000 mg | ORAL_TABLET | Freq: Every evening | ORAL | Status: DC | PRN

## 2013-02-25 NOTE — Telephone Encounter (Signed)
Patient aware Controlled Substance Contract to be sign and rx to be picked up   

## 2013-02-28 ENCOUNTER — Telehealth: Payer: Self-pay | Admitting: Internal Medicine

## 2013-02-28 NOTE — Telephone Encounter (Signed)
REFILL ON CLONAZEPAM 0.5 MG TABLET  SIG: TAKE 1 TABLET BY MOUTH AT BEDTIME AS NEEDED LAST FILLED 05.30.2012

## 2013-02-28 NOTE — Telephone Encounter (Signed)
DUPLICATE REQUEST, RX was placed at the front for pick-up, patient to sign contract prior to filling rx

## 2013-03-08 LAB — HM PAP SMEAR: HM Pap smear: NORMAL

## 2013-03-15 ENCOUNTER — Other Ambulatory Visit: Payer: Self-pay | Admitting: Internal Medicine

## 2013-04-05 IMAGING — US US SOFT TISSUE HEAD/NECK
1 series · 13 of 25 positions shown · non-contrast
Comparison: 05/06/2011

CLINICAL DATA: Multinodular thyroid gland, prior FNA of a right
lobe nodule

THYROID ULTRASOUND
TECHNIQUE: Ultrasound examination of the thyroid gland and adjacent
soft tissues was performed.

[Series 1: us soft tissue head/neck · 0.08mm/px · 57 acquisitions, 13 frames shown]
[im 1/57]
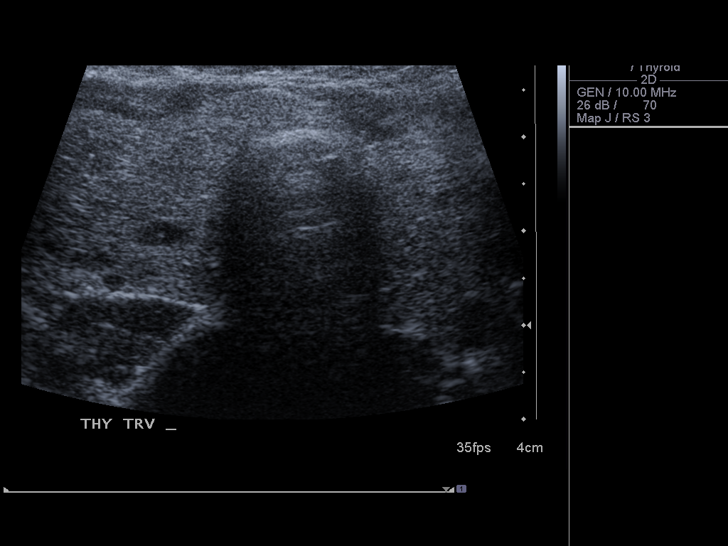
[im 5/57]
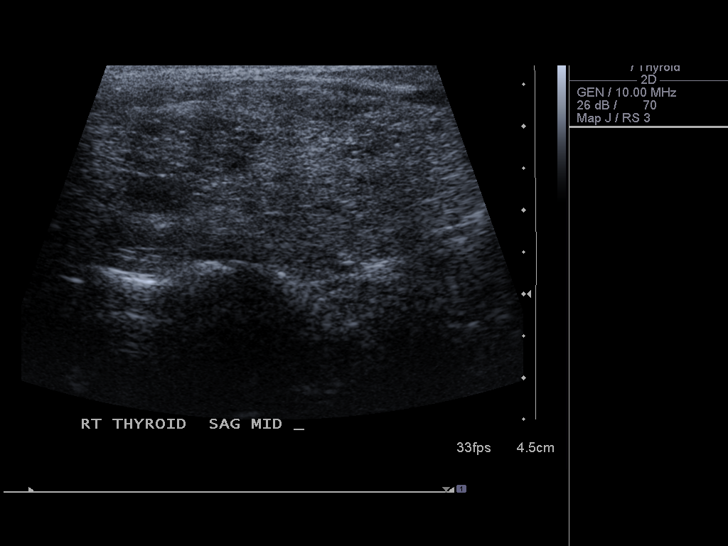
[im 10/57]
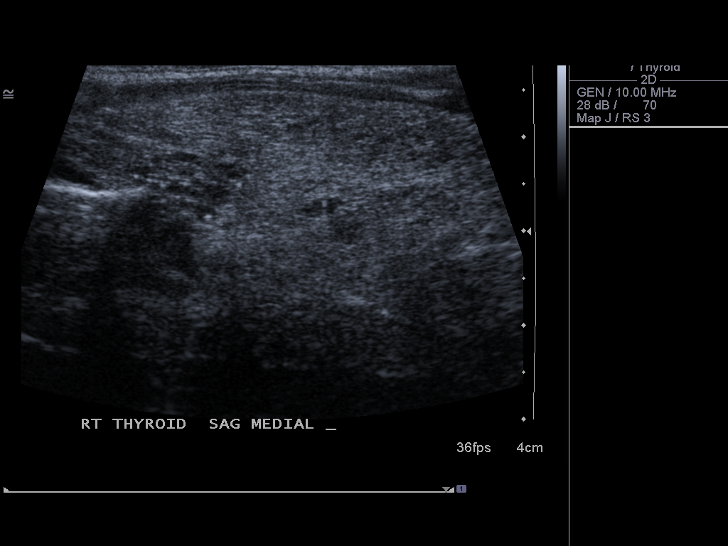
[im 15/57]
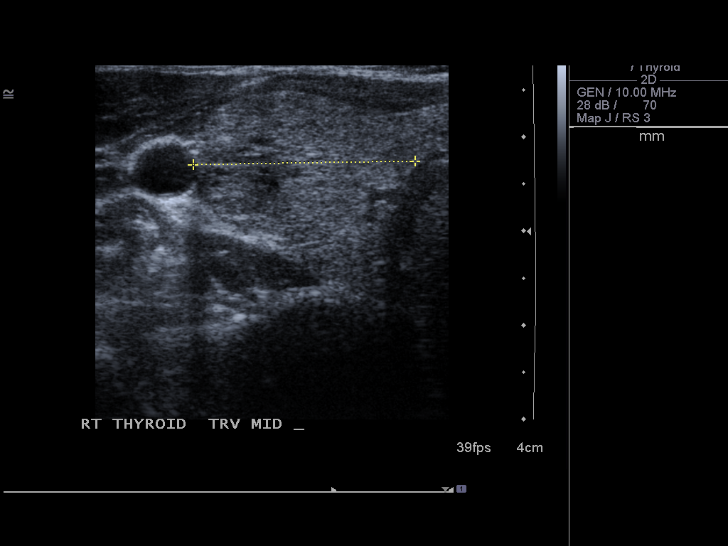
[im 19/57]
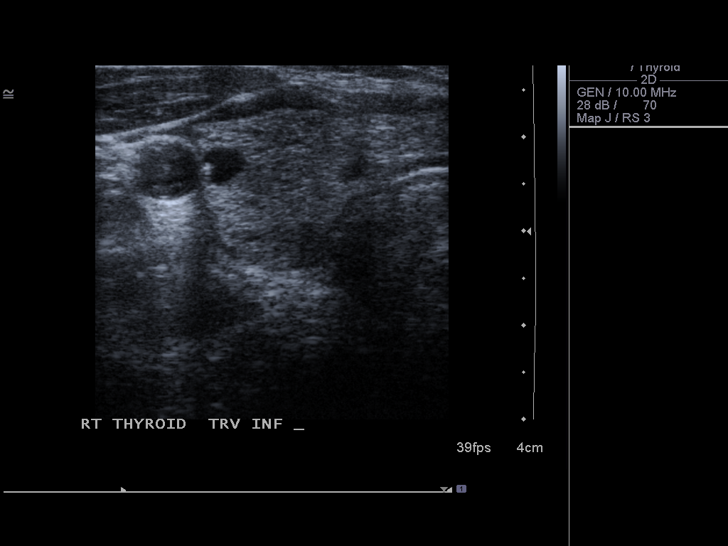
[im 24/57]
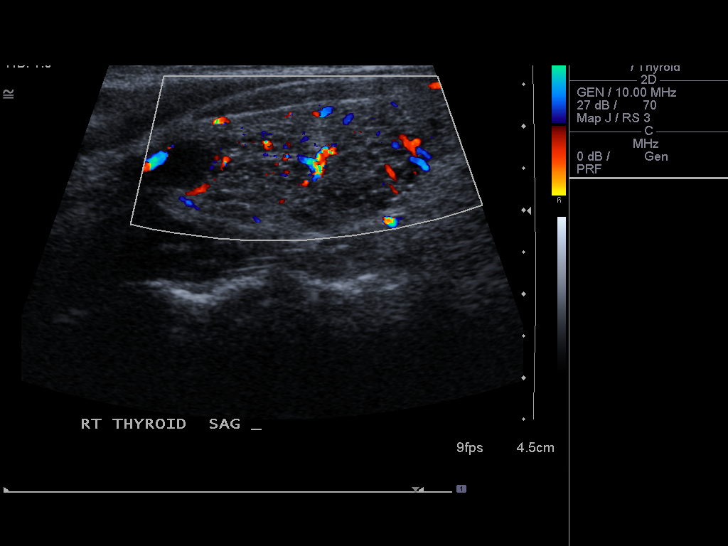
[im 29/57]
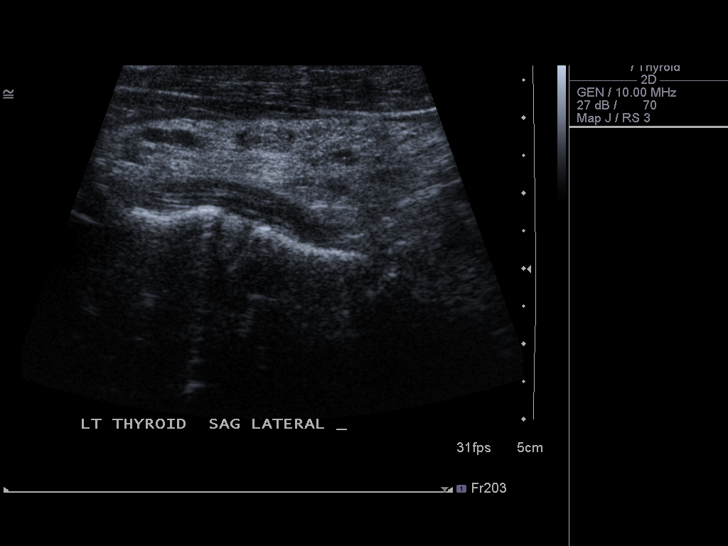
[im 33/57]
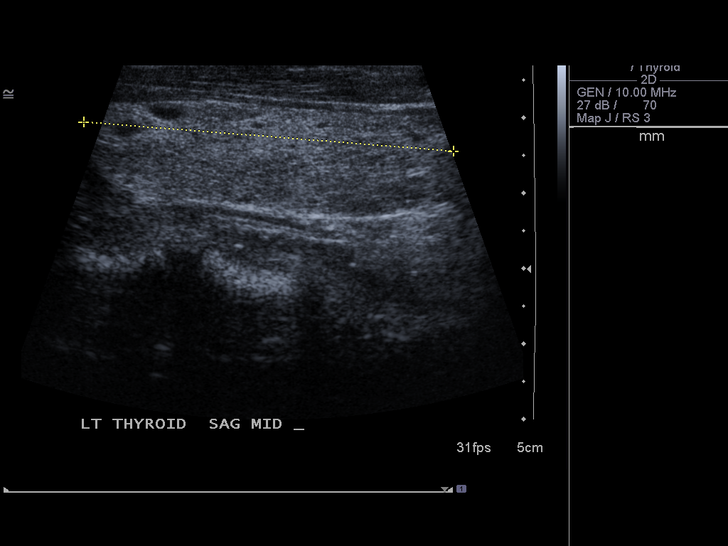
[im 38/57]
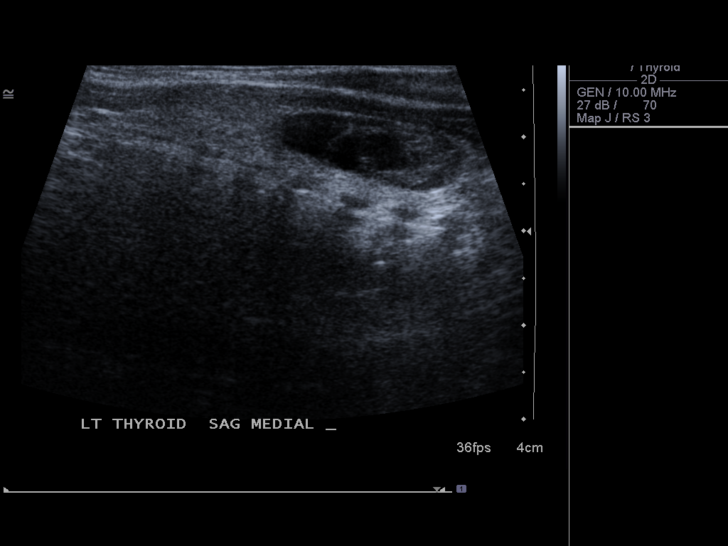
[im 43/57]
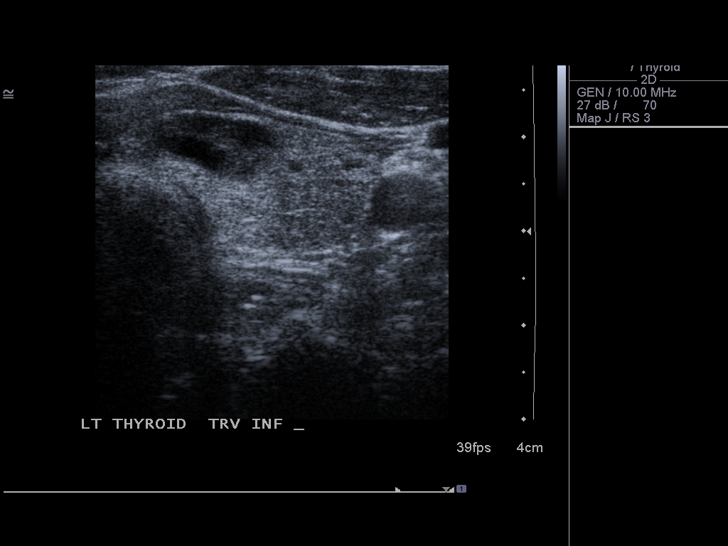
[im 47/57]
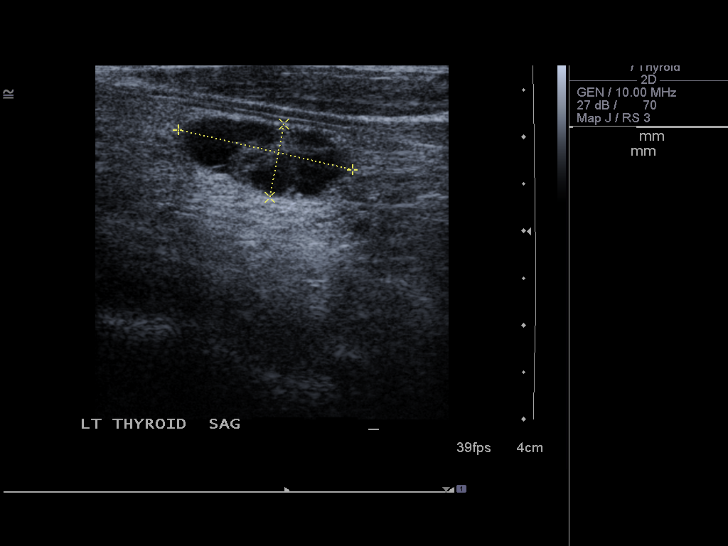
[im 52/57]
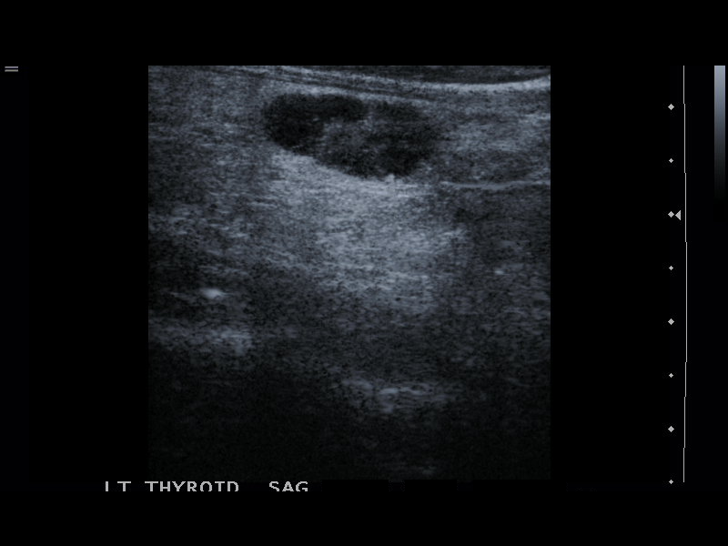
[im 57/57]
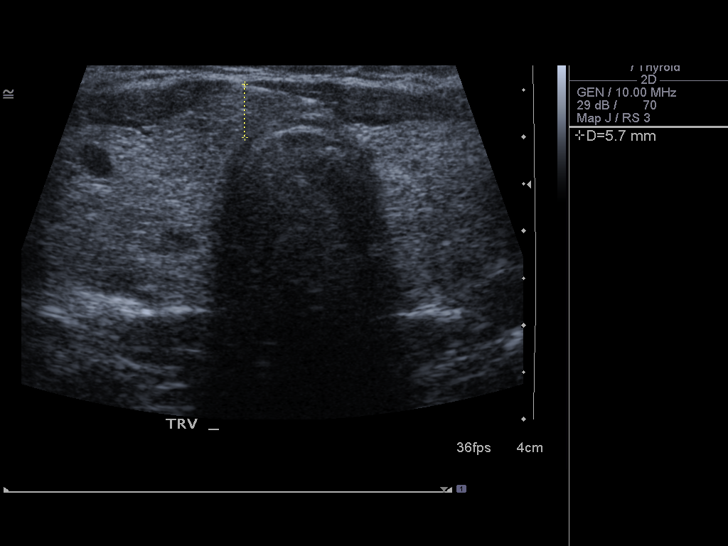

[13 of 25 positions shown; findings below may reference images not displayed]

FINDINGS: Right thyroid lobe:  5.4 x 2.2 x 2.4 cm.  Heterogeneous
echogenicity.
Left thyroid lobe:  4.9 x 1.4 x 2.1 cm.  Heterogeneous
echogenicity.
Isthmus:  6 mm thickness maximum

Focal nodules:  Bilateral thyroid nodules are identified including
a poorly defined 3.7 x 1.3 x 1.6 cm diameter nodule in the upper
pole of the right lobe, 1.9 x 0.8 x 1.6 cm diameter hypoechoic
nodule inferior left lobe, and a 9 mm nonspecific left upper pole
nodule.  The dominant nodule at the upper pole right lobe measures
larger in one dimension versus the previous exam though this may be
related to poor delineation of margins and differences in
measurement. Remaining 2 dimensions of this nodule are decreased in
size.  No enlarging nodules or calcifications identified.

Lymphadenopathy:  None identified
IMPRESSION: Multinodular large thyroid gland.
The dominant nodule at the upper pole of the right lobe measures
larger in one dimension versus previous study though this may be
related to differences in nodule visualization and measurement
accuracy.
Remaining dimensions of this nodule are smaller than on the
previous study.
No definite enlarging nodules identified.

## 2013-04-07 ENCOUNTER — Encounter: Payer: Self-pay | Admitting: Internal Medicine

## 2013-05-30 ENCOUNTER — Other Ambulatory Visit: Payer: Self-pay | Admitting: Internal Medicine

## 2013-06-08 ENCOUNTER — Encounter: Payer: Self-pay | Admitting: Internal Medicine

## 2013-06-08 ENCOUNTER — Ambulatory Visit (INDEPENDENT_AMBULATORY_CARE_PROVIDER_SITE_OTHER): Payer: Managed Care, Other (non HMO) | Admitting: Internal Medicine

## 2013-06-08 VITALS — BP 112/70 | HR 57 | Temp 98.0°F | Resp 12 | Ht 69.5 in | Wt 165.0 lb

## 2013-06-08 DIAGNOSIS — Z8639 Personal history of other endocrine, nutritional and metabolic disease: Secondary | ICD-10-CM

## 2013-06-08 DIAGNOSIS — Z862 Personal history of diseases of the blood and blood-forming organs and certain disorders involving the immune mechanism: Secondary | ICD-10-CM

## 2013-06-08 DIAGNOSIS — Z0000 Encounter for general adult medical examination without abnormal findings: Secondary | ICD-10-CM

## 2013-06-08 LAB — CBC WITH DIFFERENTIAL/PLATELET
Basophils Relative: 1.1 % (ref 0.0–3.0)
Eosinophils Relative: 2.7 % (ref 0.0–5.0)
HCT: 38.8 % (ref 36.0–46.0)
Lymphs Abs: 2.5 10*3/uL (ref 0.7–4.0)
MCHC: 32.8 g/dL (ref 30.0–36.0)
MCV: 84.2 fl (ref 78.0–100.0)
Monocytes Absolute: 0.5 10*3/uL (ref 0.1–1.0)
Neutro Abs: 2.2 10*3/uL (ref 1.4–7.7)
RBC: 4.61 Mil/uL (ref 3.87–5.11)
WBC: 5.4 10*3/uL (ref 4.5–10.5)

## 2013-06-08 LAB — BASIC METABOLIC PANEL
CO2: 23 mEq/L (ref 19–32)
Chloride: 105 mEq/L (ref 96–112)
Potassium: 4.7 mEq/L (ref 3.5–5.1)

## 2013-06-08 LAB — HEPATIC FUNCTION PANEL
ALT: 15 U/L (ref 0–35)
AST: 21 U/L (ref 0–37)
Albumin: 4.2 g/dL (ref 3.5–5.2)
Total Protein: 7.7 g/dL (ref 6.0–8.3)

## 2013-06-08 LAB — LIPID PANEL
Cholesterol: 206 mg/dL — ABNORMAL HIGH (ref 0–200)
Total CHOL/HDL Ratio: 2

## 2013-06-08 MED ORDER — CITALOPRAM HYDROBROMIDE 20 MG PO TABS: ORAL_TABLET | ORAL | Status: DC

## 2013-06-08 MED ORDER — EPINEPHRINE 0.3 MG/0.3ML IJ SOAJ: 0.3000 mg | Freq: Once | INTRAMUSCULAR | Status: DC

## 2013-06-08 MED ORDER — LEVOTHYROXINE SODIUM 25 MCG PO TABS: ORAL_TABLET | ORAL | Status: DC

## 2013-06-08 MED ORDER — CLONAZEPAM 0.5 MG PO TABS: 0.5000 mg | ORAL_TABLET | Freq: Every evening | ORAL | Status: DC | PRN

## 2013-06-08 NOTE — Patient Instructions (Addendum)

## 2013-06-08 NOTE — Progress Notes (Signed)
  Subjective:    Patient ID: Melanie Castaneda, female    DOB: [birth year], 37 y.o.   MRN: [id number]  HPI She  is here for a physical;acute issues denied.     Review of Systems   She denies blurred vision, double vision, loss of vision. She has no significant changes in her hair, nails, skin. She has occasional palpitations which are not significant. There is no numbness, tingling, burning in extremities. She also denies tremor. Weight has been stable. No bowel changes reported. She describes some cold intolerance.      Objective:   Physical Exam  Gen.: Thin but healthy and well-nourished in appearance. Alert, appropriate and cooperative throughout exam.Appears younger than stated age  Head: Normocephalic without obvious abnormalities  Eyes: No corneal or conjunctival inflammation noted. Pupils equal round reactive to light and accommodation.  Extraocular motion intact. Vision grossly normal without lenses Ears: External  ear exam reveals no significant lesions or deformities. Canals clear .TMs normal. Hearing is grossly normal bilaterally. Nose: External nasal exam reveals no deformity or inflammation. Nasal mucosa are pink and moist. No lesions or exudates noted.  Mouth: Oral mucosa and oropharynx reveal no lesions or exudates. Teeth in good repair. Neck: No deformities, masses, or tenderness noted. Range of motion normal. Thyroid asymmetric ; L lobe > R. Lungs: Normal respiratory effort; chest expands symmetrically. Lungs are clear to auscultation without rales, wheezes, or increased work of breathing. Heart: Normal rate and rhythm. Normal S1 and S2. No gallop, click, or rub.S 4 w/o murmur. Abdomen: Bowel sounds normal; abdomen soft and nontender. No masses, organomegaly or hernias noted.Aorta palpable ; no AAA Genitalia: As per Gyn                                  Musculoskeletal/extremities: No deformity or scoliosis noted of  the thoracic or lumbar spine.  No clubbing, cyanosis, edema, or  significant extremity  deformity noted. Range of motion normal .Tone & strength  Normal. Joints normal . Nail health good. Able to lie down & sit up w/o help. Negative SLR bilaterally Vascular: Carotid, radial artery, dorsalis pedis and  posterior tibial pulses are full and equal. No bruits present. Neurologic: Alert and oriented x3. Deep tendon reflexes symmetrical and normal.       Skin: Intact without suspicious lesions or rashes. Lymph: No cervical, axillary lymphadenopathy present. Psych: Mood and affect are normal. Normally interactive                                                                                        Assessment & Plan:  #1 comprehensive physical exam; no acute findings  Plan: see Orders  & Recommendations

## 2013-06-08 NOTE — Addendum Note (Signed)
Addended by: Maurice Small on: 06/08/2013 09:20 AM   Modules accepted: Orders

## 2013-06-13 ENCOUNTER — Encounter: Payer: Self-pay | Admitting: *Deleted

## 2013-06-15 ENCOUNTER — Ambulatory Visit
Admission: RE | Admit: 2013-06-15 | Discharge: 2013-06-15 | Disposition: A | Discharge status: Home / Self Care | Payer: Managed Care, Other (non HMO) | Source: Ambulatory Visit | Attending: Internal Medicine | Admitting: Internal Medicine

## 2013-06-15 ENCOUNTER — Other Ambulatory Visit: Payer: Managed Care, Other (non HMO)

## 2013-06-15 DIAGNOSIS — Z0000 Encounter for general adult medical examination without abnormal findings: Secondary | ICD-10-CM

## 2013-07-11 ENCOUNTER — Encounter: Payer: Self-pay | Admitting: Internal Medicine

## 2013-07-12 ENCOUNTER — Other Ambulatory Visit: Payer: Self-pay

## 2013-07-12 ENCOUNTER — Other Ambulatory Visit: Payer: Self-pay | Admitting: *Deleted

## 2013-07-12 ENCOUNTER — Telehealth: Payer: Self-pay | Admitting: *Deleted

## 2013-07-12 MED ORDER — EPINEPHRINE 0.3 MG/0.3ML IJ SOAJ: 0.3000 mg | Freq: Once | INTRAMUSCULAR | Status: DC

## 2013-07-12 NOTE — Telephone Encounter (Signed)
Patient needs to have a another prescription printed because it cost to much at [facility] so she needs to take it to [facility]. Please Advise.

## 2013-07-12 NOTE — Telephone Encounter (Signed)
Error

## 2013-07-12 NOTE — Addendum Note (Signed)
Addended by: Sallye Ober on: 07/12/2013 02:51 PM   Modules accepted: Orders

## 2013-07-12 NOTE — Telephone Encounter (Signed)
Patient presents to the office to get a printed Rx due to the high cost that the pharmacy she goes to is going to charge. She would like a paper Rx so that she can shop around for a better price. Cancelled electronic Rx and printed new one. Verified with Chrae and stamped with sig and gave to patient.

## 2013-07-12 NOTE — Telephone Encounter (Signed)
Rx was already given to patient to check around at different pharmacies

## 2013-09-07 ENCOUNTER — Telehealth: Payer: Self-pay | Admitting: Internal Medicine

## 2013-09-07 DIAGNOSIS — E042 Nontoxic multinodular goiter: Secondary | ICD-10-CM

## 2013-09-07 NOTE — Telephone Encounter (Signed)
Patient states that she received something after her labs came back in July that told her to come back in 3 months for labs. Did not see any instructions for labs. Patient is scheduled for 09/23/13. Please advise on lab orders.

## 2013-09-12 NOTE — Telephone Encounter (Signed)
Pt scheduled lab appt on (10-24-13) @ 8:15am for repeat TSH, and future lab ordered and sent.//AB/CMA

## 2013-09-23 ENCOUNTER — Other Ambulatory Visit: Payer: Managed Care, Other (non HMO)

## 2013-10-13 ENCOUNTER — Other Ambulatory Visit: Payer: Self-pay

## 2013-10-24 ENCOUNTER — Other Ambulatory Visit (INDEPENDENT_AMBULATORY_CARE_PROVIDER_SITE_OTHER): Payer: Managed Care, Other (non HMO)

## 2013-10-24 DIAGNOSIS — E042 Nontoxic multinodular goiter: Secondary | ICD-10-CM

## 2013-10-28 ENCOUNTER — Encounter: Payer: Self-pay | Admitting: Internal Medicine

## 2013-10-28 DIAGNOSIS — E042 Nontoxic multinodular goiter: Secondary | ICD-10-CM

## 2013-11-01 MED ORDER — LEVOTHYROXINE SODIUM 25 MCG PO TABS: ORAL_TABLET | ORAL | Status: DC

## 2013-11-21 ENCOUNTER — Encounter: Payer: Self-pay | Admitting: Internal Medicine

## 2013-12-27 ENCOUNTER — Encounter: Payer: Self-pay | Admitting: Internal Medicine

## 2013-12-28 ENCOUNTER — Other Ambulatory Visit (INDEPENDENT_AMBULATORY_CARE_PROVIDER_SITE_OTHER): Payer: Managed Care, Other (non HMO)

## 2013-12-28 DIAGNOSIS — E042 Nontoxic multinodular goiter: Secondary | ICD-10-CM

## 2013-12-29 LAB — TSH: TSH: 0.03 u[IU]/mL — ABNORMAL LOW (ref 0.35–5.50)

## 2014-01-12 ENCOUNTER — Ambulatory Visit (INDEPENDENT_AMBULATORY_CARE_PROVIDER_SITE_OTHER): Payer: Managed Care, Other (non HMO) | Admitting: Internal Medicine

## 2014-01-12 ENCOUNTER — Encounter: Payer: Self-pay | Admitting: Internal Medicine

## 2014-01-12 VITALS — BP 122/78 | HR 94 | Temp 98.0°F | Resp 16 | Wt 174.2 lb

## 2014-01-12 DIAGNOSIS — E042 Nontoxic multinodular goiter: Secondary | ICD-10-CM

## 2014-01-12 MED ORDER — LEVOTHYROXINE SODIUM 25 MCG PO TABS: ORAL_TABLET | ORAL | Status: DC

## 2014-01-12 NOTE — Progress Notes (Signed)
Pre visit review using our clinic review tool, if applicable. No additional management support is needed unless otherwise documented below in the visit note. 

## 2014-01-12 NOTE — Patient Instructions (Addendum)
Your next office appointment will be determined based upon review of your pending labs in April

## 2014-01-12 NOTE — Assessment & Plan Note (Signed)
Decrease the thyroid supplement to 25 mcg daily and recheck TSH in approximately 10-12 weeks. Repeat ultrasound July 2015.

## 2014-01-12 NOTE — Progress Notes (Signed)
   Subjective:    Patient ID: Melanie Castaneda, female    DOB: 1976/02/13, 38 y.o.   MRN: [id number]  HPI  She is only on 25 mcg of thyroid daily except 1&1/2 pills on Wednesday. Despite the small dose her TSH has dropped to 0.03. The last ultrasound in July 2014 did show possible slight increase in size in the dominant complex partially cystic nodule on the left. The dominant nodule in the right lobe was actually somewhat smaller  She has not changed the brand, does, or mode of administration otherwise.    Review of Systems   Constitutional: No significant change in weight; sleep disorder; change in appetite. Some fatigue ? Due to not exercising Eye: no blurred, double ,loss of vision Cardiovascular: no palpitations; racing; irregularity ENT/GI: no constipation; diarrhea;hoarseness;dysphagia Derm: no change in nails,hair,skin Neuro: occasional numbness in am of 3rd L finger.No tremor Psych:no anxiety; depression; panic attacks Endo:  temperature intolerance to cold        Objective:   Physical Exam Gen.:  well-nourished; in no acute distress Eyes: Extraocular motion intact; no lid lag or proptosis ,nystagmus Neck: full ROM; no masses ; thyroid normal  Heart: Normal rhythm and rate without significant murmur, gallop, or extra heart sounds Lungs: Chest clear to auscultation without rales,rales, wheezes Neuro:Deep tendon reflexes are equal and within normal limits; no tremor  Skin: Warm and dry without significant lesions or rashes; no onycholysis Lymphatic: no cervical or axillary LA Psych: Normally communicative and interactive; no abnormal mood or affect clinically.          Assessment & Plan:  See Current Assessment & Plan in Problem List under specific Diagnosis

## 2014-03-07 ENCOUNTER — Other Ambulatory Visit: Payer: Managed Care, Other (non HMO)

## 2014-03-13 ENCOUNTER — Other Ambulatory Visit (INDEPENDENT_AMBULATORY_CARE_PROVIDER_SITE_OTHER): Payer: Managed Care, Other (non HMO)

## 2014-03-13 DIAGNOSIS — E042 Nontoxic multinodular goiter: Secondary | ICD-10-CM

## 2014-03-13 LAB — TSH: TSH: 0.85 u[IU]/mL (ref 0.35–5.50)

## 2014-03-14 ENCOUNTER — Other Ambulatory Visit: Payer: Self-pay | Admitting: Internal Medicine

## 2014-03-14 DIAGNOSIS — E042 Nontoxic multinodular goiter: Secondary | ICD-10-CM

## 2014-03-14 MED ORDER — LEVOTHYROXINE SODIUM 25 MCG PO TABS: ORAL_TABLET | ORAL | Status: DC

## 2014-05-26 ENCOUNTER — Other Ambulatory Visit: Payer: Managed Care, Other (non HMO)

## 2014-05-26 DIAGNOSIS — E042 Nontoxic multinodular goiter: Secondary | ICD-10-CM

## 2014-05-26 LAB — TSH: TSH: 0.701 u[IU]/mL (ref 0.350–4.500)

## 2014-05-28 ENCOUNTER — Other Ambulatory Visit: Payer: Self-pay | Admitting: Internal Medicine

## 2014-05-28 DIAGNOSIS — E042 Nontoxic multinodular goiter: Secondary | ICD-10-CM

## 2014-06-26 ENCOUNTER — Ambulatory Visit
Admission: RE | Admit: 2014-06-26 | Discharge: 2014-06-26 | Disposition: A | Discharge status: Home / Self Care | Payer: Managed Care, Other (non HMO) | Source: Ambulatory Visit | Attending: Internal Medicine | Admitting: Internal Medicine

## 2014-06-26 DIAGNOSIS — E042 Nontoxic multinodular goiter: Secondary | ICD-10-CM

## 2014-07-17 ENCOUNTER — Other Ambulatory Visit: Payer: Self-pay

## 2014-07-17 DIAGNOSIS — E042 Nontoxic multinodular goiter: Secondary | ICD-10-CM

## 2014-07-17 MED ORDER — LEVOTHYROXINE SODIUM 25 MCG PO TABS: ORAL_TABLET | ORAL | Status: DC

## 2014-07-17 NOTE — Telephone Encounter (Signed)
1 qd EXCEPT 1& 1/2 T,Th, & Sat is correct as of 05/2014

## 2014-07-17 NOTE — Telephone Encounter (Signed)
How should patient be taking her levothyroxine 25 mcg? Directions from pharmacy are 1 tablet daily. Last directions listed on chart 1 daily except 1 1/2 tues thurs and sat

## 2014-08-31 ENCOUNTER — Other Ambulatory Visit: Payer: Self-pay | Admitting: Internal Medicine

## 2014-08-31 ENCOUNTER — Encounter: Payer: Self-pay | Admitting: Internal Medicine

## 2014-08-31 DIAGNOSIS — H04559 Acquired stenosis of unspecified nasolacrimal duct: Secondary | ICD-10-CM

## 2014-09-18 ENCOUNTER — Other Ambulatory Visit: Payer: Self-pay

## 2014-09-18 MED ORDER — CITALOPRAM HYDROBROMIDE 20 MG PO TABS: ORAL_TABLET | ORAL | Status: DC

## 2014-09-18 MED ORDER — CLONAZEPAM 0.5 MG PO TABS: 0.5000 mg | ORAL_TABLET | Freq: Every evening | ORAL | Status: DC | PRN

## 2014-09-18 NOTE — Telephone Encounter (Signed)
Both OK X1 OV before next refill

## 2014-09-26 ENCOUNTER — Encounter: Payer: Self-pay | Admitting: Internal Medicine

## 2014-12-11 ENCOUNTER — Other Ambulatory Visit: Payer: Self-pay

## 2014-12-11 DIAGNOSIS — E042 Nontoxic multinodular goiter: Secondary | ICD-10-CM

## 2014-12-11 MED ORDER — LEVOTHYROXINE SODIUM 25 MCG PO TABS: ORAL_TABLET | ORAL | Status: DC

## 2015-01-15 ENCOUNTER — Ambulatory Visit: Payer: Self-pay | Admitting: Internal Medicine

## 2015-01-18 ENCOUNTER — Other Ambulatory Visit: Payer: Self-pay

## 2015-01-18 DIAGNOSIS — E042 Nontoxic multinodular goiter: Secondary | ICD-10-CM

## 2015-01-18 MED ORDER — LEVOTHYROXINE SODIUM 25 MCG PO TABS: ORAL_TABLET | ORAL | Status: DC

## 2015-01-19 ENCOUNTER — Ambulatory Visit (INDEPENDENT_AMBULATORY_CARE_PROVIDER_SITE_OTHER): Payer: 59 | Admitting: Internal Medicine

## 2015-01-19 ENCOUNTER — Encounter: Payer: Self-pay | Admitting: Internal Medicine

## 2015-01-19 ENCOUNTER — Other Ambulatory Visit (INDEPENDENT_AMBULATORY_CARE_PROVIDER_SITE_OTHER): Payer: 59

## 2015-01-19 VITALS — BP 138/80 | HR 76 | Temp 98.0°F | Resp 15 | Ht 69.0 in | Wt 176.0 lb

## 2015-01-19 DIAGNOSIS — Z0000 Encounter for general adult medical examination without abnormal findings: Secondary | ICD-10-CM

## 2015-01-19 DIAGNOSIS — Z0189 Encounter for other specified special examinations: Secondary | ICD-10-CM

## 2015-01-19 DIAGNOSIS — E042 Nontoxic multinodular goiter: Secondary | ICD-10-CM

## 2015-01-19 LAB — CBC WITH DIFFERENTIAL/PLATELET
BASOS ABS: 0 10*3/uL (ref 0.0–0.1)
BASOS PCT: 0.6 % (ref 0.0–3.0)
EOS ABS: 0.2 10*3/uL (ref 0.0–0.7)
Eosinophils Relative: 2.2 % (ref 0.0–5.0)
HEMATOCRIT: 39.7 % (ref 36.0–46.0)
HEMOGLOBIN: 12.8 g/dL (ref 12.0–15.0)
LYMPHS ABS: 3 10*3/uL (ref 0.7–4.0)
Lymphocytes Relative: 39.3 % (ref 12.0–46.0)
MCHC: 32.4 g/dL (ref 30.0–36.0)
MCV: 80 fl (ref 78.0–100.0)
MONO ABS: 0.6 10*3/uL (ref 0.1–1.0)
Monocytes Relative: 7.7 % (ref 3.0–12.0)
NEUTROS ABS: 3.9 10*3/uL (ref 1.4–7.7)
Neutrophils Relative %: 50.2 % (ref 43.0–77.0)
Platelets: 304 10*3/uL (ref 150.0–400.0)
RBC: 4.96 Mil/uL (ref 3.87–5.11)
RDW: 15.8 % — AB (ref 11.5–15.5)
WBC: 7.7 10*3/uL (ref 4.0–10.5)

## 2015-01-19 LAB — HEPATIC FUNCTION PANEL
ALK PHOS: 55 U/L (ref 39–117)
ALT: 12 U/L (ref 0–35)
AST: 15 U/L (ref 0–37)
Albumin: 4.3 g/dL (ref 3.5–5.2)
BILIRUBIN DIRECT: 0.1 mg/dL (ref 0.0–0.3)
TOTAL PROTEIN: 7.4 g/dL (ref 6.0–8.3)
Total Bilirubin: 0.3 mg/dL (ref 0.2–1.2)

## 2015-01-19 LAB — BASIC METABOLIC PANEL
BUN: 13 mg/dL (ref 6–23)
CALCIUM: 9.3 mg/dL (ref 8.4–10.5)
CHLORIDE: 104 meq/L (ref 96–112)
CO2: 30 meq/L (ref 19–32)
CREATININE: 0.97 mg/dL (ref 0.40–1.20)
GFR: 68.95 mL/min (ref >60.00)
Glucose, Bld: 96 mg/dL (ref 70–99)
Potassium: 3.9 mEq/L (ref 3.5–5.1)
SODIUM: 138 meq/L (ref 135–145)

## 2015-01-19 LAB — TSH: TSH: 0.5 u[IU]/mL (ref 0.35–4.50)

## 2015-01-19 NOTE — Progress Notes (Signed)
Pre visit review using our clinic review tool, if applicable. No additional management support is needed unless otherwise documented below in the visit note. 

## 2015-01-19 NOTE — Patient Instructions (Signed)
  Your next office appointment will be determined based upon review of your pending labs   Those instructions will be transmitted to you through My Chart   Critical values will be called.  Followup as needed for any active or acute issue. Please report any significant change in your symptoms. 

## 2015-01-19 NOTE — Progress Notes (Signed)
   Subjective:    Patient ID: Melanie Castaneda, female    DOB: 12-Aug-1976, 39 y.o.   MRN: [id number]  HPI  She is here for a physical;acute issues denied.  She is on a heart healthy diet; she's exercising as walking/jogging a total of 7-8 miles per week without cardio pulmonary symptoms. Additionally she engages in some weight work.   Review of Systems  Constitutional: No significant change in weight; significant fatigue; sleep disorder; change in appetite. Eye: no blurred, double ,loss of vision Cardiovascular: no palpitations; racing; irregularity ENT/GI: no constipation; diarrhea;hoarseness;dysphagia Derm: no change in nails,hair,skin Neuro: no numbness or tingling; tremor Psych:no anxiety; depression; panic attacks on Citalopram 10 mg daily. She had decreased dose from 20 mg daily as symptoms controlled. Endo: Some temperature intolerance to cold      Objective:   Physical Exam Pertinent or positive findings include: The thyroid is slightly irregular; I cannot appreciate definite nodules. There is no induration. There is minor instability the knees without significant crepitus; no effusion present.  Gen.: Adequately nourished in appearance. Alert, appropriate and cooperative throughout exam. . Appears younger than stated age  Head: Normocephalic without obvious abnormalities Eyes: No corneal or conjunctival inflammation noted. Pupils equal round reactive to light and accommodation. Extraocular motion intact.  Ears: External  ear exam reveals no significant lesions or deformities. Canals clear .TMs normal. Hearing is grossly normal bilaterally. Nose: External nasal exam reveals no deformity or inflammation. Nasal mucosa are pink and moist. No lesions or exudates noted.   Mouth: Oral mucosa and oropharynx reveal no lesions or exudates. Teeth in good repair. Neck: No deformities, masses, or tenderness noted. Range of motion  Lungs: Normal respiratory effort; chest expands  symmetrically. Lungs are clear to auscultation without rales, wheezes, or increased work of breathing. Heart: Normal rate and rhythm. Normal S1 and S2. No gallop, click, or rub. No murmur. Abdomen: Bowel sounds normal; abdomen soft and nontender. No masses, organomegaly or hernias noted. Genitalia:  as per Gyn                                  Musculoskeletal/extremities: No deformity or scoliosis noted of  the thoracic or lumbar spine. . No clubbing, cyanosis, edema, or significant extremity  deformity noted.  Range of motion normal . Tone & strength normal. Hand joints normal Fingernail  health good. Able to lie down & sit up w/o help.  Negative SLR bilaterally Vascular: Carotid, radial artery, dorsalis pedis and  posterior tibial pulses are full and equal. No bruits present. Neurologic: Alert and oriented x3. Deep tendon reflexes symmetrical and normal.  Gait normal       Skin: Intact without suspicious lesions or rashes. Lymph: No cervical, axillary lymphadenopathy present. Psych: Mood and affect are normal. Normally interactive                                                                                    Assessment & Plan:  #1 comprehensive physical exam; no acute findings  Plan: see Orders  & Recommendations

## 2015-02-23 ENCOUNTER — Other Ambulatory Visit: Payer: Self-pay

## 2015-02-23 MED ORDER — CLONAZEPAM 0.5 MG PO TABS: 0.5000 mg | ORAL_TABLET | Freq: Every evening | ORAL | Status: DC | PRN

## 2015-02-23 NOTE — Telephone Encounter (Signed)
OK X 3 mos 

## 2015-02-23 NOTE — Telephone Encounter (Signed)
Clonazepam 0.5mg    LOV: 01/2015. Please advise if this is ok to refill, #, qty refill

## 2015-02-26 ENCOUNTER — Encounter: Payer: Self-pay | Admitting: Internal Medicine

## 2015-02-26 ENCOUNTER — Other Ambulatory Visit: Payer: Self-pay

## 2015-02-26 NOTE — Telephone Encounter (Signed)
Opened in error

## 2015-02-26 NOTE — Telephone Encounter (Signed)
Verified pharmacy does have patient's prescription for clonazepam. It waiting for patient to pick up

## 2015-03-21 ENCOUNTER — Encounter: Payer: Self-pay | Admitting: Internal Medicine

## 2015-03-21 MED ORDER — EPINEPHRINE 0.3 MG/0.3ML IJ SOAJ: 0.3000 mg | Freq: Once | INTRAMUSCULAR | Status: DC

## 2015-03-23 ENCOUNTER — Other Ambulatory Visit: Payer: Self-pay | Admitting: Internal Medicine

## 2015-07-30 ENCOUNTER — Encounter: Payer: Self-pay | Admitting: Internal Medicine

## 2015-07-30 NOTE — Telephone Encounter (Signed)
Please advise on lab orders

## 2015-07-31 ENCOUNTER — Other Ambulatory Visit: Payer: Self-pay | Admitting: Internal Medicine

## 2015-07-31 DIAGNOSIS — E042 Nontoxic multinodular goiter: Secondary | ICD-10-CM

## 2015-08-28 ENCOUNTER — Other Ambulatory Visit: Payer: Self-pay | Admitting: Internal Medicine

## 2015-09-11 ENCOUNTER — Other Ambulatory Visit (INDEPENDENT_AMBULATORY_CARE_PROVIDER_SITE_OTHER): Payer: 59

## 2015-09-11 ENCOUNTER — Telehealth: Payer: Self-pay | Admitting: Emergency Medicine

## 2015-09-11 DIAGNOSIS — E042 Nontoxic multinodular goiter: Secondary | ICD-10-CM

## 2015-09-11 LAB — TSH: TSH: 1.06 u[IU]/mL (ref 0.35–4.50)

## 2015-09-11 NOTE — Telephone Encounter (Signed)
Labs have been re entered.

## 2015-09-12 ENCOUNTER — Other Ambulatory Visit: Payer: Self-pay

## 2015-09-12 MED ORDER — CITALOPRAM HYDROBROMIDE 20 MG PO TABS
ORAL_TABLET | ORAL | Status: DC
Start: 2015-09-12 — End: 2016-12-10

## 2015-09-17 ENCOUNTER — Telehealth: Payer: Self-pay | Admitting: *Deleted

## 2015-09-17 MED ORDER — CLONAZEPAM 0.5 MG PO TABS: 0.5000 mg | ORAL_TABLET | Freq: Every evening | ORAL | Status: DC | PRN

## 2015-09-17 NOTE — Telephone Encounter (Signed)
OK X1 

## 2015-09-17 NOTE — Telephone Encounter (Signed)
Left msg on triage pt requesting refill on his clonazepam.../lmb

## 2015-09-17 NOTE — Telephone Encounter (Signed)
[facility].../[facility]

## 2015-09-18 ENCOUNTER — Encounter: Payer: Self-pay | Admitting: Internal Medicine

## 2015-09-18 ENCOUNTER — Ambulatory Visit (INDEPENDENT_AMBULATORY_CARE_PROVIDER_SITE_OTHER): Payer: 59 | Admitting: Internal Medicine

## 2015-09-18 VITALS — BP 110/70 | HR 68 | Temp 97.8°F | Ht 69.0 in | Wt 178.5 lb

## 2015-09-18 DIAGNOSIS — E042 Nontoxic multinodular goiter: Secondary | ICD-10-CM

## 2015-09-18 MED ORDER — LEVOTHYROXINE SODIUM 50 MCG PO TABS: ORAL_TABLET | ORAL | Status: DC

## 2015-09-18 NOTE — Progress Notes (Signed)
Pre visit review using our clinic review tool, if applicable. No additional management support is needed unless otherwise documented below in the visit note. 

## 2015-09-18 NOTE — Progress Notes (Signed)
   Subjective:    Patient ID: Melanie Castaneda, female    DOB: [birth year], 39 y.o.   MRN: [id number]  HPI  Her TSH has risen to 1.06 on the same dose of thyroid. She has been on 25 g daily except one & one half on Tuesday, Thursday, and Saturday. The generic brand was changed in May. Otherwise she's not changed how she takes the medication ; it is taken before breakfast with no other medicines.  The last thyroid ultrasound in July 2015 was reviewed. The dominant nodule had actually decreased in size. The previous biopsy in 2008 had revealed benign cytology  She remains on a Citalopram; but she has decreased it to half a day or half every other day. She states that this does result in her being much more calm.  Review of Systems Weight is up slightly; she's not exercising as much as previously.  She denies anxiety, irritability, difficulty triaging, panic, or depression.    Objective:   Physical Exam   Gen.:  Adequately nourished; in no acute distress Eyes: Extraocular motion intact; no lid lag , or proptosis . She has unsustained vertical nystagmus Neck: full ROM; no masses ; thyroid slightly irregular without definite nodularity  Heart: Normal rhythm and rate without significant murmur, gallop, or extra heart sounds Lungs: Chest clear to auscultation without rales,rales, wheezes Neuro:Deep tendon reflexes are equal and 1.5+ at the knees; no tremor  Skin/Nails: Warm and dry without significant lesions or rashes; no onycholysis Lymphatic: no cervical or axillary LA Psych: Normally communicative and interactive; no abnormal mood or affect clinically.       Assessment & Plan:  #1 nontoxic multinodular goiter; essentially stable to improved on ultrasound 7/15  TSH now 1.06; goal is 0.35.  Plan: Synthroid will be increased to 50 g daily except one half on Tuesday, Thursday, Saturday. TSH will be checked in 10-11 weeks. Follow-up ultrasound would be recommended an approximate 6  months.

## 2015-09-18 NOTE — Patient Instructions (Signed)
   Finish your present thyroid prescription by taking 2 pills Monday, Wednesday, Friday, and Sunday. Take 1 pill Tuesday, Thursday, and Saturday.  When needed mail in the new prescription for 50 g daily 1 daily except for one half on Tuesday, Thursday, and Saturday. After you've been on the new dose which averages 39.2 g a day for 10-12 weeks; repeat TSH. I would recommend repeating the thyroid ultrasound in roughly 6 months.

## 2015-11-25 ENCOUNTER — Other Ambulatory Visit: Payer: Self-pay | Admitting: Internal Medicine

## 2015-11-28 ENCOUNTER — Ambulatory Visit (INDEPENDENT_AMBULATORY_CARE_PROVIDER_SITE_OTHER): Payer: 59

## 2015-11-28 DIAGNOSIS — Z23 Encounter for immunization: Secondary | ICD-10-CM | POA: Diagnosis not present

## 2015-11-29 ENCOUNTER — Encounter: Payer: Self-pay | Admitting: Internal Medicine

## 2015-12-11 ENCOUNTER — Encounter: Payer: Self-pay | Admitting: Internal Medicine

## 2015-12-13 ENCOUNTER — Telehealth: Payer: Self-pay

## 2015-12-13 ENCOUNTER — Telehealth: Payer: Self-pay | Admitting: Internal Medicine

## 2015-12-13 DIAGNOSIS — E042 Nontoxic multinodular goiter: Secondary | ICD-10-CM

## 2015-12-13 NOTE — Telephone Encounter (Signed)
Pt return call, stated Dr. Alwyn RenHopper and her to have TSH done the end of this December, but pt didn't get here before the order expire. She was wondering if Dr. Lawerance BachBurns can put that TSH order back in so she can get her lab work done, pt schedule to see Dr. Lawerance BachBurns on 01/16/2016. Please advise and call her back

## 2015-12-13 NOTE — Telephone Encounter (Signed)
tsh ordered.

## 2015-12-13 NOTE — Telephone Encounter (Signed)
Spoke with pt to inform.  

## 2015-12-13 NOTE — Telephone Encounter (Signed)
Called patient and was unable to reach so left message to give us a call back. Patient was wanting lab work done and there are no orders put in due to her needing an appointment with new PCP. Kennyth Arnold(Stacy Burns). If patient calls back please make new appointment

## 2015-12-13 NOTE — Addendum Note (Signed)
Addended by: Pincus SanesBURNS, Arlett Goold J on: 12/13/2015 12:11 PM   Modules accepted: Orders

## 2015-12-18 ENCOUNTER — Other Ambulatory Visit (INDEPENDENT_AMBULATORY_CARE_PROVIDER_SITE_OTHER): Payer: 59

## 2015-12-18 ENCOUNTER — Encounter: Payer: Self-pay | Admitting: Internal Medicine

## 2015-12-18 DIAGNOSIS — E042 Nontoxic multinodular goiter: Secondary | ICD-10-CM

## 2015-12-18 LAB — TSH: TSH: 0.55 u[IU]/mL (ref 0.35–4.50)

## 2016-01-16 ENCOUNTER — Encounter: Payer: Self-pay | Admitting: Internal Medicine

## 2016-01-16 ENCOUNTER — Ambulatory Visit (INDEPENDENT_AMBULATORY_CARE_PROVIDER_SITE_OTHER): Payer: 59 | Admitting: Internal Medicine

## 2016-01-16 VITALS — BP 124/86 | HR 77 | Temp 98.5°F | Resp 16 | Wt 186.0 lb

## 2016-01-16 DIAGNOSIS — F329 Major depressive disorder, single episode, unspecified: Secondary | ICD-10-CM | POA: Insufficient documentation

## 2016-01-16 DIAGNOSIS — F419 Anxiety disorder, unspecified: Secondary | ICD-10-CM | POA: Insufficient documentation

## 2016-01-16 DIAGNOSIS — E042 Nontoxic multinodular goiter: Secondary | ICD-10-CM | POA: Diagnosis not present

## 2016-01-16 DIAGNOSIS — F32A Depression, unspecified: Secondary | ICD-10-CM | POA: Insufficient documentation

## 2016-01-16 DIAGNOSIS — F418 Other specified anxiety disorders: Secondary | ICD-10-CM | POA: Diagnosis not present

## 2016-01-16 MED ORDER — THERA VITAL M PO TABS: 1.0000 | ORAL_TABLET | Freq: Every day | ORAL | Status: AC

## 2016-01-16 MED ORDER — CALCIUM CARB-CHOLECALCIFEROL 600-800 MG-UNIT PO TABS: 1.0000 | ORAL_TABLET | Freq: Every day | ORAL | Status: AC

## 2016-01-16 NOTE — Assessment & Plan Note (Signed)
Check tsh in a few weeks - couple of months - ordered Continue current dose - will titrate if needed Check [place] annually - ordered

## 2016-01-16 NOTE — Assessment & Plan Note (Signed)
Controlled, stable Continue current dose of celexa -10 mg daily Clonazepam only as needed, which is very occasional - I did discuss with her the potential dangers of taking her medication regularly including addiction and memory issues.

## 2016-01-16 NOTE — Progress Notes (Signed)
Subjective:    Patient ID: Melanie Castaneda, female    DOB: [birth year], 40 y.o.   MRN: [id number]  HPI She is here to establish with a new pcp.  She is here for follow up.    Hypothyroidism:  She is taking her medication daily.  She denies any recent changes in energy or weight that are unexplained.  Dr Linna Darner wanted to keep her tsh on the low end to decrease the size of the goiter, which it has.  She has had a biopsy in the past, which was benign. She also has yearly ultrasound.    Anxiety, depression;  She is taking celexa daily-only 10 mg daily.   She takes 1/2 of a clonazepam only as needed, which is not often. She feels her anxiety and depression are well controlled.  She is happy with her current dose of medication.  Low energy level:  She sleeps well and has  good sleep quality. She is exercising regularly. She takes a multivitamin, calcium and vitamin D regularly.  Medications and allergies reviewed with patient and updated if appropriate.  Patient Active Problem List   Diagnosis Date Noted  . 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 citalopram (CELEXA) 20 MG tablet TAKE ONE TABLET BY MOUTH ONE TIME DAILY 90 tablet 1  . clonazePAM (KLONOPIN) 0.5 MG tablet Take 1 tablet (0.5 mg total) by mouth at bedtime as needed. 30 tablet 0  . EPINEPHrine (EPIPEN 2-PAK) 0.3 mg/0.3 mL IJ SOAJ injection Inject 0.3 mLs (0.3 mg total) into the muscle once. May repeat if needed 1 Device 1  . levothyroxine (SYNTHROID, LEVOTHROID) 50 MCG tablet 1 daily EXCEPT 1/2 T, Th and Sat 132 tablet 1   No current facility-administered medications on file prior to visit.    Past Medical History  Diagnosis Date  . Nontoxic multinodular goiter     S/P nodule aspiration X 1  . Anxiety disorder     on SSRI with benefit    Past Surgical History  Procedure Laterality Date  . Epidermoid cystectomy  2010     Dr  Harlow Asa  . Wisdom tooth extraction    . Thyroid nodule aspiration  2008    benign nodular goiter    Social History   Social History  . Marital Status: Married    Spouse Name: N/A  . Number of Children: N/A  . Years of Education: N/A   Social History Main Topics  . Smoking status: Former Smoker -- 0.50 packs/day    Start date: 01/10/2013  . Smokeless tobacco: Former Systems developer    Quit date: 01/08/2013     Comment: smoked 1998-2014, up to 1/2 ppd  . Alcohol Use: 1.8 oz/week    3 Glasses of wine per week     Comment: Socially; 1X/ week  . Drug Use: No  . Sexual Activity: Not Asked   Other Topics Concern  . None   Social History Narrative    Family History  Problem Relation Age of Onset  . Hyperlipidemia Father     Her advanced cholesterol panel (MMR lipoprotein) reveals that LDLDL  goal is less than 120.  . Barrett's esophagus Father   . Bipolar disorder Sister   . Hyperlipidemia Paternal Grandmother   . Thyroid disease Paternal Grandmother     hyperthyroidism  . Lung cancer Paternal Grandfather     smoker  . Thyroid disease Paternal Grandfather   .  Heart attack Paternal Grandfather     in 42s  . Diabetes Maternal Grandfather   . Diabetes Other     MGGM  . Breast cancer Other     MGGM  . Breast cancer Other     PGGM  . Stroke Neg Hx   . Thyroid disease      PG aunt    Review of Systems  Constitutional: Positive for fatigue. Negative for fever and chills.  Respiratory: Positive for cough (mild, couple weeks). Negative for shortness of breath and wheezing.   Cardiovascular: Positive for palpitations (occasion). Negative for chest pain and leg swelling.  Neurological: Negative for light-headedness and headaches.  Psychiatric/Behavioral: Negative for sleep disturbance.       Objective:   Filed Vitals:   01/16/16 1541  BP: 124/86  Pulse: 77  Temp: 98.5 F (36.9 C)  Resp: 16   Filed Weights   01/16/16 1541  Weight: 186 lb (84.369 kg)   Body mass index  is 27.45 kg/(m^2).   Physical Exam Constitutional: Appears well-developed and well-nourished. No distress.  Neck: Neck supple. No tracheal deviation present. No thyromegaly present.  No carotid bruit. No cervical adenopathy.   Cardiovascular: Normal rate, regular rhythm and normal heart sounds.   No murmur heard.  No edema Pulmonary/Chest: Effort normal and breath sounds normal. No respiratory distress. No wheezes.  Psych; normal mood and affect     Assessment & Plan:    See Problem List for Assessment and Plan of chronic medical problems.  She will follow up for a physical exam and routine blood work later this year.

## 2016-01-16 NOTE — Patient Instructions (Signed)
  We have reviewed your prior records including labs and tests today.  Test(s) ordered today. Your results will be released to MyChart (or called to you) after review, usually within 72hours after test completion. If any changes need to be made, you will be notified at that same time.   Medications reviewed and updated.  No changes recommended at this time.  An ultrasound was ordered for your thyroid.  Please schedule your physical.

## 2016-01-16 NOTE — Progress Notes (Signed)
Pre visit review using our clinic review tool, if applicable. No additional management support is needed unless otherwise documented below in the visit note. 

## 2016-03-17 ENCOUNTER — Telehealth: Payer: 59 | Admitting: Family

## 2016-03-17 DIAGNOSIS — J019 Acute sinusitis, unspecified: Secondary | ICD-10-CM

## 2016-03-17 MED ORDER — AMOXICILLIN-POT CLAVULANATE 875-125 MG PO TABS: 1.0000 | ORAL_TABLET | Freq: Two times a day (BID) | ORAL | Status: DC

## 2016-03-17 NOTE — Progress Notes (Signed)

## 2016-03-28 ENCOUNTER — Telehealth: Payer: 59 | Admitting: Family

## 2016-03-28 DIAGNOSIS — W57XXXA Bitten or stung by nonvenomous insect and other nonvenomous arthropods, initial encounter: Secondary | ICD-10-CM

## 2016-03-28 DIAGNOSIS — T148 Other injury of unspecified body region: Secondary | ICD-10-CM

## 2016-03-28 MED ORDER — PREDNISONE 10 MG (21) PO TBPK: 10.0000 mg | ORAL_TABLET | ORAL | Status: DC

## 2016-03-28 NOTE — Progress Notes (Signed)
E Visit for Rash  We are sorry that you are not feeling well. Here is how we plan to help!  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  Dermatitis is a skin rash caused by something that touches the skin and causes irritation or inflammation.  Your skin may be red, swollen, dry, cracked, and itch.  The rash should go away in a few days but can last a few weeks.  If you get a rash, it's important to figure out what caused it so the irritant can be avoided in the future. and I have prescribed a prednisone 10mg  dose pack due to the severity of your symptoms. It will have instructions from the pharmacist.   HOME CARE:   Take cool showers and avoid direct sunlight.  Apply cool compress or wet dressings.  Take a bath in an oatmeal bath.  Sprinkle content of one Aveeno packet under running faucet with comfortably warm water.  Bathe for 15-20 minutes, 1-2 times daily.  Pat dry with a towel. Do not rub the rash.  Use hydrocortisone cream.  Take an antihistamine like Benadryl for widespread rashes that itch.  The adult dose of Benadryl is 25-50 mg by mouth 4 times daily.  Caution:  This type of medication may cause sleepiness.  Do not drink alcohol, drive, or operate dangerous machinery while taking antihistamines.  Do not take these medications if you have prostate enlargement.  Read package instructions thoroughly on all medications that you take.  GET HELP RIGHT AWAY IF:   Symptoms don't go away after treatment.  Severe itching that persists.  If you rash spreads or swells.  If you rash begins to smell.  If it blisters and opens or develops a yellow-brown crust.  You develop a fever.  You have a sore throat.  You become short of breath.  MAKE SURE YOU:  Understand these instructions. Will watch your condition. Will get help right away if you are not doing well or get worse.  Thank you for choosing an e-visit. Your e-visit answers were  reviewed by a board certified advanced clinical practitioner to complete your personal care plan. Depending upon the condition, your plan could have included both over the counter or prescription medications. Please review your pharmacy choice. Be sure that the pharmacy you have chosen is open so that you can pick up your prescription now.  If there is a problem you may message your provider in MyChart to have the prescription routed to another pharmacy. Your safety is important to us. If you have drug allergies check your prescription carefully.  For the next 24 hours, you can use MyChart to ask questions about today's visit, request a non-urgent call back, or ask for a work or school excuse from your e-visit provider. You will get an email in the next two days asking about your experience. I hope that your e-visit has been valuable and will speed your recovery.

## 2016-04-14 ENCOUNTER — Other Ambulatory Visit (INDEPENDENT_AMBULATORY_CARE_PROVIDER_SITE_OTHER): Payer: 59

## 2016-04-14 ENCOUNTER — Encounter: Payer: Self-pay | Admitting: Internal Medicine

## 2016-04-14 ENCOUNTER — Other Ambulatory Visit: Payer: Self-pay | Admitting: Internal Medicine

## 2016-04-14 DIAGNOSIS — E042 Nontoxic multinodular goiter: Secondary | ICD-10-CM

## 2016-04-14 LAB — TSH: TSH: 0.51 u[IU]/mL (ref 0.35–4.50)

## 2016-04-18 ENCOUNTER — Ambulatory Visit
Admission: RE | Admit: 2016-04-18 | Discharge: 2016-04-18 | Disposition: A | Discharge status: Home / Self Care | Payer: 59 | Source: Ambulatory Visit | Attending: Internal Medicine | Admitting: Internal Medicine

## 2016-04-18 DIAGNOSIS — E042 Nontoxic multinodular goiter: Secondary | ICD-10-CM

## 2016-04-19 ENCOUNTER — Encounter: Payer: Self-pay | Admitting: Internal Medicine

## 2016-04-24 ENCOUNTER — Encounter: Payer: Self-pay | Admitting: Internal Medicine

## 2016-04-24 DIAGNOSIS — L0591 Pilonidal cyst without abscess: Secondary | ICD-10-CM

## 2016-06-10 ENCOUNTER — Other Ambulatory Visit: Payer: Self-pay | Admitting: Internal Medicine

## 2016-08-26 ENCOUNTER — Other Ambulatory Visit: Payer: Self-pay | Admitting: Internal Medicine

## 2016-08-27 MED ORDER — LEVOTHYROXINE SODIUM 25 MCG PO TABS: ORAL_TABLET | ORAL | 0 refills | Status: DC

## 2016-08-30 ENCOUNTER — Other Ambulatory Visit: Payer: Self-pay | Admitting: Internal Medicine

## 2016-10-09 ENCOUNTER — Encounter: Payer: Self-pay | Admitting: Internal Medicine

## 2016-10-09 DIAGNOSIS — E039 Hypothyroidism, unspecified: Secondary | ICD-10-CM

## 2016-10-17 ENCOUNTER — Other Ambulatory Visit (INDEPENDENT_AMBULATORY_CARE_PROVIDER_SITE_OTHER): Payer: 59

## 2016-10-17 ENCOUNTER — Encounter: Payer: Self-pay | Admitting: Internal Medicine

## 2016-10-17 DIAGNOSIS — E039 Hypothyroidism, unspecified: Secondary | ICD-10-CM | POA: Diagnosis not present

## 2016-10-17 LAB — TSH: TSH: 0.38 u[IU]/mL (ref 0.35–4.50)

## 2016-12-07 ENCOUNTER — Encounter: Payer: Self-pay | Admitting: Internal Medicine

## 2016-12-09 NOTE — Telephone Encounter (Signed)
Ok to fill but only for two months - due for a follow up or CPE -- needs to be seen at least once a year.

## 2016-12-10 ENCOUNTER — Other Ambulatory Visit: Payer: Self-pay | Admitting: Emergency Medicine

## 2016-12-10 MED ORDER — CITALOPRAM HYDROBROMIDE 20 MG PO TABS: ORAL_TABLET | ORAL | 1 refills | Status: DC

## 2017-01-16 ENCOUNTER — Ambulatory Visit (INDEPENDENT_AMBULATORY_CARE_PROVIDER_SITE_OTHER): Payer: 59 | Admitting: Internal Medicine

## 2017-01-16 ENCOUNTER — Encounter: Payer: Self-pay | Admitting: Internal Medicine

## 2017-01-16 VITALS — BP 122/84 | HR 63 | Temp 97.9°F | Resp 16 | Ht 69.0 in | Wt 195.0 lb

## 2017-01-16 DIAGNOSIS — F418 Other specified anxiety disorders: Secondary | ICD-10-CM | POA: Diagnosis not present

## 2017-01-16 DIAGNOSIS — E042 Nontoxic multinodular goiter: Secondary | ICD-10-CM | POA: Diagnosis not present

## 2017-01-16 DIAGNOSIS — F329 Major depressive disorder, single episode, unspecified: Secondary | ICD-10-CM

## 2017-01-16 DIAGNOSIS — F32A Depression, unspecified: Secondary | ICD-10-CM

## 2017-01-16 DIAGNOSIS — Z0000 Encounter for general adult medical examination without abnormal findings: Secondary | ICD-10-CM | POA: Diagnosis not present

## 2017-01-16 DIAGNOSIS — F419 Anxiety disorder, unspecified: Secondary | ICD-10-CM

## 2017-01-16 MED ORDER — CITALOPRAM HYDROBROMIDE 10 MG PO TABS: 10.0000 mg | ORAL_TABLET | Freq: Every day | ORAL | 3 refills | Status: DC

## 2017-01-16 NOTE — Progress Notes (Signed)
Subjective:    Patient ID: Melanie Castaneda, female    DOB: 12-08-1976, 41 y.o.   MRN: [id number]  HPI She is here for a physical exam.   Working on weight loss.  Exercising regularly.    Try to work on diet.    Anxiety, depression:  She decreased the celexa to 10 mg every other day.  She feels good on that dose.  When she tried to decrease it further she had headaches.  She wants to stay at this dose for now.   Hypothyroidism:  She is taking her medication daily.  She denies any recent changes in energy or weight that are unexplained.    Medications and allergies reviewed with patient and updated if appropriate.  Patient Active Problem List   Diagnosis Date Noted  . Anxiety and depression 01/16/2016  . 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 Calcium Carb-Cholecalciferol (CALCIUM 600/VITAMIN D3) 600-800 MG-UNIT TABS Take 1 tablet by mouth daily. 60 tablet   . citalopram (CELEXA) 20 MG tablet TAKE ONE TABLET BY MOUTH ONE TIME DAILY (Patient taking differently: TAKE ONE/HALF TABLET BY MOUTH EVERY OTHER DAY) 30 tablet 1  . clonazePAM (KLONOPIN) 0.5 MG tablet Take 1 tablet (0.5 mg total) by mouth at bedtime as needed. 30 tablet 0  . EPINEPHrine (EPIPEN 2-PAK) 0.3 mg/0.3 mL IJ SOAJ injection Inject 0.3 mLs (0.3 mg total) into the muscle once. May repeat if needed 1 Device 1  . levothyroxine (SYNTHROID, LEVOTHROID) 25 MCG tablet TAKE 1 TABLET BY MOUTH  DAILY BEFORE BREAKFAST  EXCEPT ONE AND ONE-HALF  TABLETS ON TUESDAY,  THURSDAY, AND SATURDAY 111 tablet 1  . Multiple Vitamins-Minerals (MULTIVITAMIN) tablet Take 1 tablet by mouth daily.     No current facility-administered medications on file prior to visit.     Past Medical History:  Diagnosis Date  . Anxiety disorder    on SSRI with benefit  . Nontoxic multinodular goiter    S/P nodule aspiration X 1    Past Surgical History:    Procedure Laterality Date  . Epidermoid cystectomy  2010    Dr Harlow Asa  . Thyroid Nodule Aspiration  2008   benign nodular goiter  . WISDOM TOOTH EXTRACTION      Social History   Social History  . Marital status: Married    Spouse name: N/A  . Number of children: N/A  . Years of education: N/A   Social History Main Topics  . Smoking status: Former Smoker    Packs/day: 0.50    Start date: 01/10/2013  . Smokeless tobacco: Former Systems developer    Quit date: 01/08/2013     Comment: smoked [id number], up to 1/2 ppd  . Alcohol use 1.8 oz/week    3 Glasses of wine per week     Comment: Socially; 1X/ week  . Drug use: No  . Sexual activity: Not on file   Other Topics Concern  . Not on file   Social History Narrative  . No narrative on file    Family History  Problem Relation Age of Onset  . Hyperlipidemia Father     Her advanced cholesterol panel (MMR lipoprotein) reveals that LDLDL  goal is less than 120.  . Barrett's esophagus Father   . Bipolar disorder Sister   . Hyperlipidemia Paternal Grandmother   . Thyroid disease Paternal Grandmother     hyperthyroidism  . Lung cancer Paternal  Grandfather     smoker  . Thyroid disease Paternal Grandfather   . Heart attack Paternal Grandfather     in 44s  . Diabetes Maternal Grandfather   . Diabetes Other     MGGM  . Breast cancer Other     MGGM  . Breast cancer Other     PGGM  . Stroke Neg Hx   . Thyroid disease      PG aunt    Review of Systems  Constitutional: Negative for chills, fatigue and fever.  Eyes: Negative for visual disturbance.  Respiratory: Negative for cough, shortness of breath and wheezing.   Cardiovascular: Negative for chest pain, palpitations and leg swelling.  Gastrointestinal: Negative for abdominal pain, blood in stool, constipation, diarrhea and nausea.       No gerd  Genitourinary: Negative for dysuria and hematuria.  Musculoskeletal: Negative for arthralgias and back pain.  Skin: Negative for color  change and rash.  Neurological: Positive for headaches (occ). Negative for light-headedness.  Psychiatric/Behavioral: Positive for dysphoric mood. The patient is nervous/anxious.        Objective:   Vitals:   01/16/17 1404  BP: 122/84  Pulse: 63  Resp: 16  Temp: 97.9 F (36.6 C)   Filed Weights   01/16/17 1404  Weight: 195 lb (88.5 kg)   Body mass index is 28.8 kg/m.  Wt Readings from Last 3 Encounters:  01/16/17 195 lb (88.5 kg)  01/16/16 186 lb (84.4 kg)  09/18/15 178 lb 8 oz (81 kg)     Physical Exam Constitutional: She appears well-developed and well-nourished. No distress.  HENT:  Head: Normocephalic and atraumatic.  Right Ear: External ear normal. Normal ear canal and TM Left Ear: External ear normal.  Normal ear canal and TM Mouth/Throat: Oropharynx is clear and moist.  Eyes: Conjunctivae and EOM are normal.  Neck: Neck supple. No tracheal deviation present. No thyromegaly present.  No carotid bruit  Cardiovascular: Normal rate, regular rhythm and normal heart sounds.   No murmur heard.  No edema. Pulmonary/Chest: Effort normal and breath sounds normal. No respiratory distress. She has no wheezes. She has no rales.  Breast: deferred to Gyn Abdominal: Soft. She exhibits no distension. There is no tenderness.  Lymphadenopathy: She has no cervical adenopathy.  Skin: Skin is warm and dry. She is not diaphoretic.  Psychiatric: She has a normal mood and affect. Her behavior is normal.         Assessment & Plan:   Physical exam: Screening blood work ordered Immunizations  Up to date  Gyn  Up to date  Exercise  - exercising regularly Weight   - working on weight loss Skin - no  concerns Substance abuse  none  See Problem List for Assessment and Plan of chronic medical problems.   FU in one year

## 2017-01-16 NOTE — Assessment & Plan Note (Addendum)
Check US every other year  - next due 2018 Check tsh - titrate if needed

## 2017-01-16 NOTE — Patient Instructions (Addendum)
Test(s) ordered today. Your results will be released to MyChart (or called to you) after review, usually within 72hours after test completion. If any changes need to be made, you will be notified at that same time.  All other Health Maintenance issues reviewed.   All recommended immunizations and age-appropriate screenings are up-to-date or discussed.  No immunizations administered today.   Medications reviewed and updated.  No changes recommended at this time.   Please followup in one year   Health Maintenance, Female Introduction Adopting a healthy lifestyle and getting preventive care can go a long way to promote health and wellness. Talk with your health care provider about what schedule of regular examinations is right for you. This is a good chance for you to check in with your provider about disease prevention and staying healthy. In between checkups, there are plenty of things you can do on your own. Experts have done a lot of research about which lifestyle changes and preventive measures are most likely to keep you healthy. Ask your health care provider for more information. Weight and diet Eat a healthy diet  Be sure to include plenty of vegetables, fruits, low-fat dairy products, and lean protein.  Do not eat a lot of foods high in solid fats, added sugars, or salt.  Get regular exercise. This is one of the most important things you can do for your health.  Most adults should exercise for at least 150 minutes each week. The exercise should increase your heart rate and make you sweat (moderate-intensity exercise).  Most adults should also do strengthening exercises at least twice a week. This is in addition to the moderate-intensity exercise. Maintain a healthy weight  Body mass index (BMI) is a measurement that can be used to identify possible weight problems. It estimates body fat based on height and weight. Your health care provider can help determine your BMI and help  you achieve or maintain a healthy weight.  For females 20 years of age and older:  A BMI below 18.5 is considered underweight.  A BMI of 18.5 to 24.9 is normal.  A BMI of 25 to 29.9 is considered overweight.  A BMI of 30 and above is considered obese. Watch levels of cholesterol and blood lipids  You should start having your blood tested for lipids and cholesterol at 39 years of age, then have this test every 5 years.  You may need to have your cholesterol levels checked more often if:  Your lipid or cholesterol levels are high.  You are older than 39 years of age.  You are at high risk for heart disease. Cancer screening Lung Cancer  Lung cancer screening is recommended for adults 55-80 years old who are at high risk for lung cancer because of a history of smoking.  A yearly low-dose CT scan of the lungs is recommended for people who:  Currently smoke.  Have quit within the past 15 years.  Have at least a 30-pack-year history of smoking. A pack year is smoking an average of one pack of cigarettes a day for 1 year.  Yearly screening should continue until it has been 15 years since you quit.  Yearly screening should stop if you develop a health problem that would prevent you from having lung cancer treatment. Breast Cancer  Practice breast self-awareness. This means understanding how your breasts normally appear and feel.  It also means doing regular breast self-exams. Let your health care provider know about any changes, no matter how   small.  If you are in your 20s or 30s, you should have a clinical breast exam (CBE) by a health care provider every 1-3 years as part of a regular health exam.  If you are 40 or older, have a CBE every year. Also consider having a breast X-ray (mammogram) every year.  If you have a family history of breast cancer, talk to your health care provider about genetic screening.  If you are at high risk for breast cancer, talk to your health  care provider about having an MRI and a mammogram every year.  Breast cancer gene (BRCA) assessment is recommended for women who have family members with BRCA-related cancers. BRCA-related cancers include:  Breast.  Ovarian.  Tubal.  Peritoneal cancers.  Results of the assessment will determine the need for genetic counseling and BRCA1 and BRCA2 testing. Cervical Cancer  Your health care provider may recommend that you be screened regularly for cancer of the pelvic organs (ovaries, uterus, and vagina). This screening involves a pelvic examination, including checking for microscopic changes to the surface of your cervix (Pap test). You may be encouraged to have this screening done every 3 years, beginning at age 21.  For women ages 30-65, health care providers may recommend pelvic exams and Pap testing every 3 years, or they may recommend the Pap and pelvic exam, combined with testing for human papilloma virus (HPV), every 5 years. Some types of HPV increase your risk of cervical cancer. Testing for HPV may also be done on women of any age with unclear Pap test results.  Other health care providers may not recommend any screening for nonpregnant women who are considered low risk for pelvic cancer and who do not have symptoms. Ask your health care provider if a screening pelvic exam is right for you.  If you have had past treatment for cervical cancer or a condition that could lead to cancer, you need Pap tests and screening for cancer for at least 20 years after your treatment. If Pap tests have been discontinued, your risk factors (such as having a new sexual partner) need to be reassessed to determine if screening should resume. Some women have medical problems that increase the chance of getting cervical cancer. In these cases, your health care provider may recommend more frequent screening and Pap tests. Colorectal Cancer  This type of cancer can be detected and often prevented.  Routine  colorectal cancer screening usually begins at 39 years of age and continues through 39 years of age.  Your health care provider may recommend screening at an earlier age if you have risk factors for colon cancer.  Your health care provider may also recommend using home test kits to check for hidden blood in the stool.  A small camera at the end of a tube can be used to examine your colon directly (sigmoidoscopy or colonoscopy). This is done to check for the earliest forms of colorectal cancer.  Routine screening usually begins at age 50.  Direct examination of the colon should be repeated every 5-10 years through 39 years of age. However, you may need to be screened more often if early forms of precancerous polyps or small growths are found. Skin Cancer  Check your skin from head to toe regularly.  Tell your health care provider about any new moles or changes in moles, especially if there is a change in a mole's shape or color.  Also tell your health care provider if you have a mole that is   is larger than the size of a pencil eraser.  Always use sunscreen. Apply sunscreen liberally and repeatedly throughout the day.  Protect yourself by wearing long sleeves, pants, a wide-brimmed hat, and sunglasses whenever you are outside. Heart disease, diabetes, and high blood pressure  High blood pressure causes heart disease and increases the risk of stroke. High blood pressure is more likely to develop in:  People who have blood pressure in the high end of the normal range (130-139/85-89 mm Hg).  People who are overweight or obese.  People who are African American.  If you are 3-19 years of age, have your blood pressure checked every 3-5 years. If you are 29 years of age or older, have your blood pressure checked every year. You should have your blood pressure measured twice-once when you are at a hospital or clinic, and once when you are not at a hospital or clinic. Record the average of the two  measurements. To check your blood pressure when you are not at a hospital or clinic, you can use:  An automated blood pressure machine at a pharmacy.  A home blood pressure monitor.  If you are between 36 years and 58 years old, ask your health care provider if you should take aspirin to prevent strokes.  Have regular diabetes screenings. This involves taking a blood sample to check your fasting blood sugar level.  If you are at a normal weight and have a low risk for diabetes, have this test once every three years after 39 years of age.  If you are overweight and have a high risk for diabetes, consider being tested at a younger age or more often. Preventing infection Hepatitis B  If you have a higher risk for hepatitis B, you should be screened for this virus. You are considered at high risk for hepatitis B if:  You were born in a country where hepatitis B is common. Ask your health care provider which countries are considered high risk.  Your parents were born in a high-risk country, and you have not been immunized against hepatitis B (hepatitis B vaccine).  You have HIV or AIDS.  You use needles to inject street drugs.  You live with someone who has hepatitis B.  You have had sex with someone who has hepatitis B.  You get hemodialysis treatment.  You take certain medicines for conditions, including cancer, organ transplantation, and autoimmune conditions. Hepatitis C  Blood testing is recommended for:  Everyone born from [birth year] through 1963.  Anyone with known risk factors for hepatitis C. Sexually transmitted infections (STIs)  You should be screened for sexually transmitted infections (STIs) including gonorrhea and chlamydia if:  You are sexually active and are younger than 39 years of age.  You are older than 39 years of age and your health care provider tells you that you are at risk for this type of infection.  Your sexual activity has changed since you were last  screened and you are at an increased risk for chlamydia or gonorrhea. Ask your health care provider if you are at risk.  If you do not have HIV, but are at risk, it may be recommended that you take a prescription medicine daily to prevent HIV infection. This is called pre-exposure prophylaxis (PrEP). You are considered at risk if:  You are sexually active and do not regularly use condoms or know the HIV status of your partner(s).  You take drugs by injection.  You are sexually active with a  partner who has HIV. Talk with your health care provider about whether you are at high risk of being infected with HIV. If you choose to begin PrEP, you should first be tested for HIV. You should then be tested every 3 months for as long as you are taking PrEP. Pregnancy  If you are premenopausal and you may become pregnant, ask your health care provider about preconception counseling.  If you may become pregnant, take 400 to 800 micrograms (mcg) of folic acid every day.  If you want to prevent pregnancy, talk to your health care provider about birth control (contraception). Osteoporosis and menopause  Osteoporosis is a disease in which the bones lose minerals and strength with aging. This can result in serious bone fractures. Your risk for osteoporosis can be identified using a bone density scan.  If you are 55 years of age or older, or if you are at risk for osteoporosis and fractures, ask your health care provider if you should be screened.  Ask your health care provider whether you should take a calcium or vitamin D supplement to lower your risk for osteoporosis.  Menopause may have certain physical symptoms and risks.  Hormone replacement therapy may reduce some of these symptoms and risks. Talk to your health care provider about whether hormone replacement therapy is right for you. Follow these instructions at home:  Schedule regular health, dental, and eye exams.  Stay current with your  immunizations.  Do not use any tobacco products including cigarettes, chewing tobacco, or electronic cigarettes.  If you are pregnant, do not drink alcohol.  If you are breastfeeding, limit how much and how often you drink alcohol.  Limit alcohol intake to no more than 1 drink per day for nonpregnant women. One drink equals 12 ounces of beer, 5 ounces of wine, or 1 ounces of hard liquor.  Do not use street drugs.  Do not share needles.  Ask your health care provider for help if you need support or information about quitting drugs.  Tell your health care provider if you often feel depressed.  Tell your health care provider if you have ever been abused or do not feel safe at home. This information is not intended to replace advice given to you by your health care provider. Make sure you discuss any questions you have with your health care provider. Document Released: 06/09/2011 Document Revised: 05/01/2016 Document Reviewed: 08/28/2015  2017 [employer]

## 2017-01-16 NOTE — Assessment & Plan Note (Addendum)
Has decreased celexa to 10 mg every other day - she will adjust dose as needed Uses clonazepam as needed, but not often

## 2017-01-16 NOTE — Progress Notes (Signed)
Pre visit review using our clinic review tool, if applicable. No additional management support is needed unless otherwise documented below in the visit note. 

## 2017-02-04 ENCOUNTER — Other Ambulatory Visit (INDEPENDENT_AMBULATORY_CARE_PROVIDER_SITE_OTHER): Payer: 59

## 2017-02-04 ENCOUNTER — Encounter: Payer: Self-pay | Admitting: Internal Medicine

## 2017-02-04 DIAGNOSIS — E042 Nontoxic multinodular goiter: Secondary | ICD-10-CM

## 2017-02-04 DIAGNOSIS — Z0000 Encounter for general adult medical examination without abnormal findings: Secondary | ICD-10-CM

## 2017-02-04 LAB — COMPREHENSIVE METABOLIC PANEL
ALT: 13 U/L (ref 0–35)
AST: 16 U/L (ref 0–37)
Albumin: 4.2 g/dL (ref 3.5–5.2)
Alkaline Phosphatase: 46 U/L (ref 39–117)
BILIRUBIN TOTAL: 0.4 mg/dL (ref 0.2–1.2)
BUN: 12 mg/dL (ref 6–23)
CALCIUM: 9.3 mg/dL (ref 8.4–10.5)
CO2: 26 meq/L (ref 19–32)
CREATININE: 0.94 mg/dL (ref 0.40–1.20)
Chloride: 103 mEq/L (ref 96–112)
GFR: 70.71 mL/min (ref >60.00)
GLUCOSE: 92 mg/dL (ref 70–99)
Potassium: 4.6 mEq/L (ref 3.5–5.1)
Sodium: 137 mEq/L (ref 135–145)
Total Protein: 7.4 g/dL (ref 6.0–8.3)

## 2017-02-04 LAB — LIPID PANEL
CHOL/HDL RATIO: 2
Cholesterol: 202 mg/dL — ABNORMAL HIGH (ref 0–200)
HDL: 83.1 mg/dL (ref >39.00)
LDL Cholesterol: 105 mg/dL — ABNORMAL HIGH (ref 0–99)
NONHDL: 118.95
Triglycerides: 72 mg/dL (ref 0.0–149.0)
VLDL: 14.4 mg/dL (ref 0.0–40.0)

## 2017-02-04 LAB — CBC WITH DIFFERENTIAL/PLATELET
BASOS ABS: 0.1 10*3/uL (ref 0.0–0.1)
Basophils Relative: 0.9 % (ref 0.0–3.0)
EOS ABS: 0.3 10*3/uL (ref 0.0–0.7)
Eosinophils Relative: 3.9 % (ref 0.0–5.0)
HEMATOCRIT: 40.9 % (ref 36.0–46.0)
Hemoglobin: 13.2 g/dL (ref 12.0–15.0)
LYMPHS PCT: 37.1 % (ref 12.0–46.0)
Lymphs Abs: 2.4 10*3/uL (ref 0.7–4.0)
MCHC: 32.2 g/dL (ref 30.0–36.0)
MCV: 83 fl (ref 78.0–100.0)
Monocytes Absolute: 0.6 10*3/uL (ref 0.1–1.0)
Monocytes Relative: 9.8 % (ref 3.0–12.0)
NEUTROS ABS: 3.1 10*3/uL (ref 1.4–7.7)
NEUTROS PCT: 48.3 % (ref 43.0–77.0)
PLATELETS: 289 10*3/uL (ref 150.0–400.0)
RBC: 4.93 Mil/uL (ref 3.87–5.11)
RDW: 14.2 % (ref 11.5–15.5)
WBC: 6.5 10*3/uL (ref 4.0–10.5)

## 2017-02-04 LAB — TSH: TSH: 0.76 u[IU]/mL (ref 0.35–4.50)

## 2017-02-13 ENCOUNTER — Other Ambulatory Visit: Payer: Self-pay | Admitting: Internal Medicine

## 2017-02-13 ENCOUNTER — Encounter: Payer: Self-pay | Admitting: Internal Medicine

## 2017-07-28 ENCOUNTER — Other Ambulatory Visit: Payer: Self-pay | Admitting: Internal Medicine

## 2017-12-25 ENCOUNTER — Encounter: Payer: Self-pay | Admitting: Internal Medicine

## 2017-12-25 ENCOUNTER — Other Ambulatory Visit: Payer: Self-pay | Admitting: Internal Medicine

## 2017-12-25 MED ORDER — LEVOTHYROXINE SODIUM 25 MCG PO TABS: ORAL_TABLET | ORAL | 0 refills | Status: DC

## 2018-01-19 DIAGNOSIS — E039 Hypothyroidism, unspecified: Secondary | ICD-10-CM | POA: Insufficient documentation

## 2018-01-19 NOTE — Progress Notes (Signed)
Subjective:    Patient ID: Melanie Castaneda, female    DOB: [birth year], 42 y.o.   MRN: [id number]  HPI She is here for a physical exam.   She is following with a therapist and was diagnosed with ADD.  She is interested in trying medication.  She will have his report sent to me.   She has anxiety and it varies.   Overall she feels her anxiety is well controlled.  She takes the clonazepam only as needed.    Medications and allergies reviewed with patient and updated if appropriate.  Patient Active Problem List   Diagnosis Date Noted  . ADD (attention deficit disorder) 01/20/2018  . Hypothyroidism 01/19/2018  . Anxiety and depression 01/16/2016  . 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 Calcium Carb-Cholecalciferol (CALCIUM 600/VITAMIN D3) 600-800 MG-UNIT TABS Take 1 tablet by mouth daily. 60 tablet   . citalopram (CELEXA) 10 MG tablet Take 1 tablet (10 mg total) by mouth daily. 90 tablet 3  . clonazePAM (KLONOPIN) 0.5 MG tablet Take 1 tablet (0.5 mg total) by mouth at bedtime as needed. 30 tablet 0  . EPINEPHrine (EPIPEN 2-PAK) 0.3 mg/0.3 mL IJ SOAJ injection Inject 0.3 mLs (0.3 mg total) into the muscle once. May repeat if needed 1 Device 1  . levothyroxine (SYNTHROID, LEVOTHROID) 25 MCG tablet TAKE 1 TABLET BY MOUTH  DAILY BEFORE BREAKFAST  EXCEPT ONE AND ONE-HALF  TABLETS ON TUESDAY,  THURSDAY, AND SATURDAY 111 tablet 0  . Multiple Vitamins-Minerals (MULTIVITAMIN) tablet Take 1 tablet by mouth daily.     No current facility-administered medications on file prior to visit.     Past Medical History:  Diagnosis Date  . Anxiety disorder    on SSRI with benefit  . Nontoxic multinodular goiter    S/P nodule aspiration X 1    Past Surgical History:  Procedure Laterality Date  . Epidermoid cystectomy  2010    Dr Harlow Asa  . Thyroid Nodule Aspiration  2008   benign nodular goiter  .  WISDOM TOOTH EXTRACTION      Social History   Socioeconomic History  . Marital status: Married    Spouse name: None  . Number of children: None  . Years of education: None  . Highest education level: None  Social Needs  . Financial resource strain: None  . Food insecurity - worry: None  . Food insecurity - inability: None  . Transportation needs - medical: None  . Transportation needs - non-medical: None  Occupational History  . None  Tobacco Use  . Smoking status: Former Smoker    Packs/day: 0.50    Start date: 01/10/2013  . Smokeless tobacco: Former Systems developer    Quit date: 01/08/2013  . Tobacco comment: smoked [phone], up to 1/2 ppd  Substance and Sexual Activity  . Alcohol use: Yes    Alcohol/week: 1.8 oz    Types: 3 Glasses of wine per week    Comment: Socially; 1X/ week  . Drug use: No  . Sexual activity: None  Other Topics Concern  . None  Social History Narrative   Exercise:  regular    Family History  Problem Relation Age of Onset  . Hyperlipidemia Father        Her advanced cholesterol panel (MMR lipoprotein) reveals that LDLDL  goal is less than 120.  . Barrett's esophagus Father   . Bipolar disorder Sister   .  Hyperlipidemia Paternal Grandmother   . Thyroid disease Paternal Grandmother        hyperthyroidism  . Breast cancer Paternal Grandmother   . Lung cancer Paternal Grandfather        smoker  . Thyroid disease Paternal Grandfather   . Heart attack Paternal Grandfather        in 4s  . Diabetes Maternal Grandfather   . Diabetes Other        MGGM  . Breast cancer Other        MGGM  . Breast cancer Other        PGGM  . Thyroid disease Unknown        PG aunt  . Stroke Neg Hx     Review of Systems  Constitutional: Negative for appetite change, chills and fever.  Eyes: Negative for visual disturbance.  Respiratory: Negative for cough, shortness of breath and wheezing.   Cardiovascular: Negative for chest pain, palpitations and leg swelling.    Gastrointestinal: Negative for abdominal pain, blood in stool, constipation, diarrhea and nausea.  Genitourinary: Negative for dysuria and hematuria.  Musculoskeletal: Negative for arthralgias, back pain and myalgias.  Skin: Negative for color change and rash.  Neurological: Positive for headaches (occasional). Negative for dizziness and light-headedness.  Psychiatric/Behavioral: Positive for decreased concentration. Negative for dysphoric mood and sleep disturbance. The patient is nervous/anxious (controlled).        Objective:   Vitals:   01/20/18 1409  BP: 102/72  Pulse: 60  Resp: 16  Temp: 98.2 F (36.8 C)  SpO2: 98%   Filed Weights   01/20/18 1409  Weight: 191 lb (86.6 kg)   Body mass index is 28.21 kg/m.  Wt Readings from Last 3 Encounters:  01/20/18 191 lb (86.6 kg)  01/16/17 195 lb (88.5 kg)  01/16/16 186 lb (84.4 kg)     Physical Exam Constitutional: She appears well-developed and well-nourished. No distress.  HENT:  Head: Normocephalic and atraumatic.  Right Ear: External ear normal. Normal ear canal and TM Left Ear: External ear normal.  Normal ear canal and TM Mouth/Throat: Oropharynx is clear and moist.  Eyes: Conjunctivae and EOM are normal.  Neck: Neck supple. No tracheal deviation present. No thyromegaly present.  No carotid bruit  Cardiovascular: Normal rate, regular rhythm and normal heart sounds.   No murmur heard.  No edema. Pulmonary/Chest: Effort normal and breath sounds normal. No respiratory distress. She has no wheezes. She has no rales.  Breast: deferred to Gyn Abdominal: Soft. She exhibits no distension. There is no tenderness.  Lymphadenopathy: She has no cervical adenopathy.  Skin: Skin is warm and dry. She is not diaphoretic.  Psychiatric: She has a normal mood and affect. Her behavior is normal.        Assessment & Plan:   Physical exam: Screening blood work  ordered Immunizations  Flu vaccine and td up to date Gyn    Up to  date  Eye exams  Up to date  Exercise  3-4 times a week Weight   Overweight - will work on weight loss  Skin mildly overweight Substance abuse  none  See Problem List for Assessment and Plan of chronic medical problems.

## 2018-01-19 NOTE — Patient Instructions (Addendum)
Test(s) ordered today. Your results will be released to [place] (or called to you) after review, usually within 72hours after test completion. If any changes need to be made, you will be notified at that same time.  All other Health Maintenance issues reviewed.   All recommended immunizations and age-appropriate screenings are up-to-date or discussed.  No immunizations administered today.   Medications reviewed and updated.  Changes include trying Adderall 10 mg for ADD.   Your prescription(s) have been submitted to your pharmacy. Please take as directed and contact our office if you believe you are having problem(s) with the medication(s).   Please followup in 3 months, sooner if needed   Health Maintenance, Female Adopting a healthy lifestyle and getting preventive care can go a long way to promote health and wellness. Talk with your health care provider about what schedule of regular examinations is right for you. This is a good chance for you to check in with your provider about disease prevention and staying healthy. In between checkups, there are plenty of things you can do on your own. Experts have done a lot of research about which lifestyle changes and preventive measures are most likely to keep you healthy. Ask your health care provider for more information. Weight and diet Eat a healthy diet  Be sure to include plenty of vegetables, fruits, low-fat dairy products, and lean protein.  Do not eat a lot of foods high in solid fats, added sugars, or salt.  Get regular exercise. This is one of the most important things you can do for your health. ? Most adults should exercise for at least 150 minutes each week. The exercise should increase your heart rate and make you sweat (moderate-intensity exercise). ? Most adults should also do strengthening exercises at least twice a week. This is in addition to the moderate-intensity exercise.  Maintain a healthy weight  Body mass index  (BMI) is a measurement that can be used to identify possible weight problems. It estimates body fat based on height and weight. Your health care provider can help determine your BMI and help you achieve or maintain a healthy weight.  For females 61 years of age and older: ? A BMI below 18.5 is considered underweight. ? A BMI of 18.5 to 24.9 is normal. ? A BMI of 25 to 29.9 is considered overweight. ? A BMI of 30 and above is considered obese.  Watch levels of cholesterol and blood lipids  You should start having your blood tested for lipids and cholesterol at 40 years of age, then have this test every 5 years.  You may need to have your cholesterol levels checked more often if: ? Your lipid or cholesterol levels are high. ? You are older than 40 years of age. ? You are at high risk for heart disease.  Cancer screening Lung Cancer  Lung cancer screening is recommended for adults 43-59 years old who are at high risk for lung cancer because of a history of smoking.  A yearly low-dose CT scan of the lungs is recommended for people who: ? Currently smoke. ? Have quit within the past 15 years. ? Have at least a 30-pack-year history of smoking. A pack year is smoking an average of one pack of cigarettes a day for 1 year.  Yearly screening should continue until it has been 15 years since you quit.  Yearly screening should stop if you develop a health problem that would prevent you from having lung cancer treatment.  Breast Cancer  Practice breast self-awareness. This means understanding how your breasts normally appear and feel.  It also means doing regular breast self-exams. Let your health care provider know about any changes, no matter how small.  If you are in your 20s or 30s, you should have a clinical breast exam (CBE) by a health care provider every 1-3 years as part of a regular health exam.  If you are 44 or older, have a CBE every year. Also consider having a breast X-ray  (mammogram) every year.  If you have a family history of breast cancer, talk to your health care provider about genetic screening.  If you are at high risk for breast cancer, talk to your health care provider about having an MRI and a mammogram every year.  Breast cancer gene (BRCA) assessment is recommended for women who have family members with BRCA-related cancers. BRCA-related cancers include: ? Breast. ? Ovarian. ? Tubal. ? Peritoneal cancers.  Results of the assessment will determine the need for genetic counseling and BRCA1 and BRCA2 testing.  Cervical Cancer Your health care provider may recommend that you be screened regularly for cancer of the pelvic organs (ovaries, uterus, and vagina). This screening involves a pelvic examination, including checking for microscopic changes to the surface of your cervix (Pap test). You may be encouraged to have this screening done every 3 years, beginning at age 83.  For women ages 80-65, health care providers may recommend pelvic exams and Pap testing every 3 years, or they may recommend the Pap and pelvic exam, combined with testing for human papilloma virus (HPV), every 5 years. Some types of HPV increase your risk of cervical cancer. Testing for HPV may also be done on women of any age with unclear Pap test results.  Other health care providers may not recommend any screening for nonpregnant women who are considered low risk for pelvic cancer and who do not have symptoms. Ask your health care provider if a screening pelvic exam is right for you.  If you have had past treatment for cervical cancer or a condition that could lead to cancer, you need Pap tests and screening for cancer for at least 20 years after your treatment. If Pap tests have been discontinued, your risk factors (such as having a new sexual partner) need to be reassessed to determine if screening should resume. Some women have medical problems that increase the chance of getting  cervical cancer. In these cases, your health care provider may recommend more frequent screening and Pap tests.  Colorectal Cancer  This type of cancer can be detected and often prevented.  Routine colorectal cancer screening usually begins at 40 years of age and continues through 40 years of age.  Your health care provider may recommend screening at an earlier age if you have risk factors for colon cancer.  Your health care provider may also recommend using home test kits to check for hidden blood in the stool.  A small camera at the end of a tube can be used to examine your colon directly (sigmoidoscopy or colonoscopy). This is done to check for the earliest forms of colorectal cancer.  Routine screening usually begins at age 35.  Direct examination of the colon should be repeated every 5-10 years through 40 years of age. However, you may need to be screened more often if early forms of precancerous polyps or small growths are found.  Skin Cancer  Check your skin from head to toe regularly.  Tell  your health care provider about any new moles or changes in moles, especially if there is a change in a mole's shape or color.  Also tell your health care provider if you have a mole that is larger than the size of a pencil eraser.  Always use sunscreen. Apply sunscreen liberally and repeatedly throughout the day.  Protect yourself by wearing long sleeves, pants, a wide-brimmed hat, and sunglasses whenever you are outside.  Heart disease, diabetes, and high blood pressure  High blood pressure causes heart disease and increases the risk of stroke. High blood pressure is more likely to develop in: ? People who have blood pressure in the high end of the normal range (130-139/85-89 mm Hg). ? People who are overweight or obese. ? People who are African American.  If you are 89-70 years of age, have your blood pressure checked every 3-5 years. If you are 67 years of age or older, have your  blood pressure checked every year. You should have your blood pressure measured twice-once when you are at a hospital or clinic, and once when you are not at a hospital or clinic. Record the average of the two measurements. To check your blood pressure when you are not at a hospital or clinic, you can use: ? An automated blood pressure machine at a pharmacy. ? A home blood pressure monitor.  If you are between 65 years and 33 years old, ask your health care provider if you should take aspirin to prevent strokes.  Have regular diabetes screenings. This involves taking a blood sample to check your fasting blood sugar level. ? If you are at a normal weight and have a low risk for diabetes, have this test once every three years after 40 years of age. ? If you are overweight and have a high risk for diabetes, consider being tested at a younger age or more often. Preventing infection Hepatitis B  If you have a higher risk for hepatitis B, you should be screened for this virus. You are considered at high risk for hepatitis B if: ? You were born in a country where hepatitis B is common. Ask your health care provider which countries are considered high risk. ? Your parents were born in a high-risk country, and you have not been immunized against hepatitis B (hepatitis B vaccine). ? You have HIV or AIDS. ? You use needles to inject street drugs. ? You live with someone who has hepatitis B. ? You have had sex with someone who has hepatitis B. ? You get hemodialysis treatment. ? You take certain medicines for conditions, including cancer, organ transplantation, and autoimmune conditions.  Hepatitis C  Blood testing is recommended for: ? Everyone born from [birth year] through 1963. ? Anyone with known risk factors for hepatitis C.  Sexually transmitted infections (STIs)  You should be screened for sexually transmitted infections (STIs) including gonorrhea and chlamydia if: ? You are sexually active and  are younger than 40 years of age. ? You are older than 40 years of age and your health care provider tells you that you are at risk for this type of infection. ? Your sexual activity has changed since you were last screened and you are at an increased risk for chlamydia or gonorrhea. Ask your health care provider if you are at risk.  If you do not have HIV, but are at risk, it may be recommended that you take a prescription medicine daily to prevent HIV infection. This is called pre-exposure  prophylaxis (PrEP). You are considered at risk if: ? You are sexually active and do not regularly use condoms or know the HIV status of your partner(s). ? You take drugs by injection. ? You are sexually active with a partner who has HIV.  Talk with your health care provider about whether you are at high risk of being infected with HIV. If you choose to begin PrEP, you should first be tested for HIV. You should then be tested every 3 months for as long as you are taking PrEP. Pregnancy  If you are premenopausal and you may become pregnant, ask your health care provider about preconception counseling.  If you may become pregnant, take 400 to 800 micrograms (mcg) of folic acid every day.  If you want to prevent pregnancy, talk to your health care provider about birth control (contraception). Osteoporosis and menopause  Osteoporosis is a disease in which the bones lose minerals and strength with aging. This can result in serious bone fractures. Your risk for osteoporosis can be identified using a bone density scan.  If you are 75 years of age or older, or if you are at risk for osteoporosis and fractures, ask your health care provider if you should be screened.  Ask your health care provider whether you should take a calcium or vitamin D supplement to lower your risk for osteoporosis.  Menopause may have certain physical symptoms and risks.  Hormone replacement therapy may reduce some of these symptoms and  risks. Talk to your health care provider about whether hormone replacement therapy is right for you. Follow these instructions at home:  Schedule regular health, dental, and eye exams.  Stay current with your immunizations.  Do not use any tobacco products including cigarettes, chewing tobacco, or electronic cigarettes.  If you are pregnant, do not drink alcohol.  If you are breastfeeding, limit how much and how often you drink alcohol.  Limit alcohol intake to no more than 1 drink per day for nonpregnant women. One drink equals 12 ounces of beer, 5 ounces of wine, or 1 ounces of hard liquor.  Do not use street drugs.  Do not share needles.  Ask your health care provider for help if you need support or information about quitting drugs.  Tell your health care provider if you often feel depressed.  Tell your health care provider if you have ever been abused or do not feel safe at home. This information is not intended to replace advice given to you by your health care provider. Make sure you discuss any questions you have with your health care provider. Document Released: 06/09/2011 Document Revised: 05/01/2016 Document Reviewed: 08/28/2015 Elsevier Interactive Patient Education  [employer].

## 2018-01-20 ENCOUNTER — Encounter: Payer: Self-pay | Admitting: Internal Medicine

## 2018-01-20 ENCOUNTER — Other Ambulatory Visit (INDEPENDENT_AMBULATORY_CARE_PROVIDER_SITE_OTHER): Payer: 59

## 2018-01-20 ENCOUNTER — Ambulatory Visit (INDEPENDENT_AMBULATORY_CARE_PROVIDER_SITE_OTHER): Payer: 59 | Admitting: Internal Medicine

## 2018-01-20 ENCOUNTER — Encounter: Payer: 59 | Admitting: Internal Medicine

## 2018-01-20 VITALS — BP 102/72 | HR 60 | Temp 98.2°F | Resp 16 | Wt 191.0 lb

## 2018-01-20 DIAGNOSIS — F329 Major depressive disorder, single episode, unspecified: Secondary | ICD-10-CM | POA: Diagnosis not present

## 2018-01-20 DIAGNOSIS — Z0000 Encounter for general adult medical examination without abnormal findings: Secondary | ICD-10-CM

## 2018-01-20 DIAGNOSIS — F419 Anxiety disorder, unspecified: Secondary | ICD-10-CM | POA: Diagnosis not present

## 2018-01-20 DIAGNOSIS — E038 Other specified hypothyroidism: Secondary | ICD-10-CM

## 2018-01-20 DIAGNOSIS — F32A Depression, unspecified: Secondary | ICD-10-CM

## 2018-01-20 DIAGNOSIS — F988 Other specified behavioral and emotional disorders with onset usually occurring in childhood and adolescence: Secondary | ICD-10-CM

## 2018-01-20 LAB — LIPID PANEL
Cholesterol: 206 mg/dL — ABNORMAL HIGH (ref 0–200)
HDL: 84 mg/dL (ref >39.00)
LDL Cholesterol: 104 mg/dL — ABNORMAL HIGH (ref 0–99)
NONHDL: 121.65
Total CHOL/HDL Ratio: 2
Triglycerides: 87 mg/dL (ref 0.0–149.0)
VLDL: 17.4 mg/dL (ref 0.0–40.0)

## 2018-01-20 LAB — COMPREHENSIVE METABOLIC PANEL
ALK PHOS: 49 U/L (ref 39–117)
ALT: 12 U/L (ref 0–35)
AST: 13 U/L (ref 0–37)
Albumin: 4.3 g/dL (ref 3.5–5.2)
BILIRUBIN TOTAL: 0.3 mg/dL (ref 0.2–1.2)
BUN: 14 mg/dL (ref 6–23)
CO2: 27 mEq/L (ref 19–32)
Calcium: 9.5 mg/dL (ref 8.4–10.5)
Chloride: 102 mEq/L (ref 96–112)
Creatinine, Ser: 0.87 mg/dL (ref 0.40–1.20)
GFR: 76.92 mL/min (ref >60.00)
GLUCOSE: 91 mg/dL (ref 70–99)
Potassium: 4 mEq/L (ref 3.5–5.1)
Sodium: 137 mEq/L (ref 135–145)
TOTAL PROTEIN: 7.4 g/dL (ref 6.0–8.3)

## 2018-01-20 LAB — CBC WITH DIFFERENTIAL/PLATELET
BASOS ABS: 0.1 10*3/uL (ref 0.0–0.1)
Basophils Relative: 0.9 % (ref 0.0–3.0)
EOS ABS: 0.2 10*3/uL (ref 0.0–0.7)
Eosinophils Relative: 2.8 % (ref 0.0–5.0)
HCT: 41.3 % (ref 36.0–46.0)
Hemoglobin: 13.2 g/dL (ref 12.0–15.0)
LYMPHS ABS: 2.6 10*3/uL (ref 0.7–4.0)
Lymphocytes Relative: 31.3 % (ref 12.0–46.0)
MCHC: 31.9 g/dL (ref 30.0–36.0)
MCV: 82.9 fl (ref 78.0–100.0)
MONO ABS: 0.8 10*3/uL (ref 0.1–1.0)
MONOS PCT: 9.7 % (ref 3.0–12.0)
NEUTROS PCT: 55.3 % (ref 43.0–77.0)
Neutro Abs: 4.7 10*3/uL (ref 1.4–7.7)
Platelets: 330 10*3/uL (ref 150.0–400.0)
RBC: 4.98 Mil/uL (ref 3.87–5.11)
RDW: 14.6 % (ref 11.5–15.5)
WBC: 8.4 10*3/uL (ref 4.0–10.5)

## 2018-01-20 LAB — TSH: TSH: 0.37 u[IU]/mL (ref 0.35–4.50)

## 2018-01-20 MED ORDER — AMPHETAMINE-DEXTROAMPHET ER 10 MG PO CP24: 10.0000 mg | ORAL_CAPSULE | Freq: Every day | ORAL | 0 refills | Status: DC

## 2018-01-20 NOTE — Assessment & Plan Note (Signed)
Check tsh  Titrate med dose if needed  

## 2018-01-20 NOTE — Assessment & Plan Note (Signed)
Seeing a therapist Controlled, stable Continue current dose of medication

## 2018-01-20 NOTE — Assessment & Plan Note (Addendum)
New Diagnosed with a therapist - will have report sent to me Will start adderall xr 10 mg daily Will likely need to increase the dose - she will update me in 2 weeks or so - can increase to 20 mg and f/u in 3 months  Discussed possible side effects

## 2018-01-21 ENCOUNTER — Encounter: Payer: Self-pay | Admitting: Internal Medicine

## 2018-01-21 ENCOUNTER — Telehealth: Payer: Self-pay | Admitting: Emergency Medicine

## 2018-01-21 DIAGNOSIS — F988 Other specified behavioral and emotional disorders with onset usually occurring in childhood and adolescence: Secondary | ICD-10-CM

## 2018-01-21 NOTE — Telephone Encounter (Signed)
PA for Adderall was completed and faxed to insurance, unable to complete online. [facility]

## 2018-01-27 ENCOUNTER — Encounter: Payer: Self-pay | Admitting: Emergency Medicine

## 2018-01-29 NOTE — Telephone Encounter (Signed)
PA for Adderall has been approved through 12/21/2018. Pharmacy notified.

## 2018-02-25 ENCOUNTER — Other Ambulatory Visit: Payer: Self-pay | Admitting: Internal Medicine

## 2018-02-25 MED ORDER — AMPHETAMINE-DEXTROAMPHET ER 10 MG PO CP24: 10.0000 mg | ORAL_CAPSULE | Freq: Every day | ORAL | 0 refills | Status: DC

## 2018-02-25 NOTE — Telephone Encounter (Signed)
Check [place] registry last filled 01/26/2018../[email or web address]  

## 2018-03-04 ENCOUNTER — Other Ambulatory Visit: Payer: Self-pay | Admitting: Internal Medicine

## 2018-03-22 ENCOUNTER — Other Ambulatory Visit: Payer: Self-pay | Admitting: Internal Medicine

## 2018-04-19 ENCOUNTER — Ambulatory Visit: Payer: 59 | Admitting: Internal Medicine

## 2018-04-26 ENCOUNTER — Encounter: Payer: Self-pay | Admitting: Internal Medicine

## 2018-04-26 ENCOUNTER — Other Ambulatory Visit: Payer: Self-pay | Admitting: Internal Medicine

## 2018-04-26 MED ORDER — LEVOTHYROXINE SODIUM 25 MCG PO TABS: ORAL_TABLET | ORAL | 0 refills | Status: DC

## 2018-04-26 MED ORDER — AMPHETAMINE-DEXTROAMPHET ER 10 MG PO CP24: 10.0000 mg | ORAL_CAPSULE | Freq: Every day | ORAL | 0 refills | Status: DC

## 2018-04-26 NOTE — Telephone Encounter (Signed)
[place] Controlled Substance Database checked. Last filled on 02/25/18 

## 2018-05-04 ENCOUNTER — Ambulatory Visit: Payer: 59 | Admitting: Internal Medicine

## 2018-05-09 NOTE — Progress Notes (Signed)
Subjective:    Patient ID: Melanie Castaneda, female    DOB: December 17, 1975, 42 y.o.   MRN: [id number]  HPI The patient is here for follow up.  Right ear:  It feels like she has fluid in the ear but nothing comes out.  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 She has occasional achiness.  She has morning congestion only related to allergies.  She denies any other cold symptoms including fevers, sinus pressure or sore throat. She tried flushing her ear out with H2O2.   ADD:  She is taking her medication as prescribed.  She feels the medication is effective.  She is not taking the medication daily - she does not take it on the weekends and some days during the week she does not take it.  she denies side effects, including palpitations, headaches, lightheadedness, decreased appetite and weight loss. She is happy with the dose.    Depression: She is taking her medication daily as prescribed. She denies any side effects from the medication. She feels her depression is well controlled and she is happy with her current dose of medication.   Anxiety: She is taking her medication daily as prescribed. She denies any side effects from the medication. She feels her anxiety is well controlled and she is happy with her current dose of medication.   Multinodular goiter:  Stable appearance for 10 years.    Medications and allergies reviewed with patient and updated if appropriate.  Patient Active Problem List   Diagnosis Date Noted  . ADD (attention deficit disorder) 01/20/2018  . Hypothyroidism 01/19/2018  . Anxiety and depression 01/16/2016  . 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 amphetamine-dextroamphetamine (ADDERALL XR) 10 MG 24 hr capsule Take 1 capsule (10 mg total) by mouth daily. 30 capsule 0  . Calcium Carb-Cholecalciferol (CALCIUM 600/VITAMIN D3) 600-800 MG-UNIT TABS Take 1 tablet by mouth daily. 60  tablet   . citalopram (CELEXA) 10 MG tablet TAKE 1 TABLET (10 MG TOTAL) BY MOUTH DAILY. 90 tablet 1  . clonazePAM (KLONOPIN) 0.5 MG tablet Take 1 tablet (0.5 mg total) by mouth at bedtime as needed. 30 tablet 0  . EPINEPHrine (EPIPEN 2-PAK) 0.3 mg/0.3 mL IJ SOAJ injection Inject 0.3 mLs (0.3 mg total) into the muscle once. May repeat if needed 1 Device 1  . levothyroxine (SYNTHROID, LEVOTHROID) 25 MCG tablet TAKE 1 TABLET DAILY BEFORE  BREAKFAST EXCEPT TAKE ONE  AND ONE-HALF TABLETS ON  TUESDAY, THURSDAY, AND  SATURDAY 111 tablet 0  . Multiple Vitamins-Minerals (MULTIVITAMIN) tablet Take 1 tablet by mouth daily.     No current facility-administered medications on file prior to visit.     Past Medical History:  Diagnosis Date  . Anxiety disorder    on SSRI with benefit  . Nontoxic multinodular goiter    S/P nodule aspiration X 1    Past Surgical History:  Procedure Laterality Date  . Epidermoid cystectomy  2010    Dr Harlow Asa  . Thyroid Nodule Aspiration  2008   benign nodular goiter  . WISDOM TOOTH EXTRACTION      Social History   Socioeconomic History  . Marital status: Married    Spouse name: Not on file  . Number of children: Not on file  . Years of education: Not on file  . Highest education level: Not on file  Occupational History  . Not on file  Social Needs  . Financial resource strain: Not on file  . Food insecurity:    Worry: Not on file    Inability: Not on file  . Transportation needs:    Medical: Not on file    Non-medical: Not on file  Tobacco Use  . Smoking status: Former Smoker    Packs/day: 0.50    Start date: 01/10/2013  . Smokeless tobacco: Former Systems developer    Quit date: 01/08/2013  . Tobacco comment: smoked [phone], up to 1/2 ppd  Substance and Sexual Activity  . Alcohol use: Yes    Alcohol/week: 1.8 oz    Types: 3 Glasses of wine per week    Comment: Socially; 1X/ week  . Drug use: No  . Sexual activity: Not on file  Lifestyle  . Physical  activity:    Days per week: Not on file    Minutes per session: Not on file  . Stress: Not on file  Relationships  . Social connections:    Talks on phone: Not on file    Gets together: Not on file    Attends religious service: Not on file    Active member of club or organization: Not on file    Attends meetings of clubs or organizations: Not on file    Relationship status: Not on file  Other Topics Concern  . Not on file  Social History Narrative   Exercise:  regular    Family History  Problem Relation Age of Onset  . Hyperlipidemia Father        Her advanced cholesterol panel (MMR lipoprotein) reveals that LDLDL  goal is less than 120.  . Barrett's esophagus Father   . Bipolar disorder Sister   . Hyperlipidemia Paternal Grandmother   . Thyroid disease Paternal Grandmother        hyperthyroidism  . Breast cancer Paternal Grandmother   . Lung cancer Paternal Grandfather        smoker  . Thyroid disease Paternal Grandfather   . Heart attack Paternal Grandfather        in 20s  . Diabetes Maternal Grandfather   . Diabetes Other        MGGM  . Breast cancer Other        MGGM  . Breast cancer Other        PGGM  . Thyroid disease Unknown        PG aunt  . Stroke Neg Hx     Review of Systems  Constitutional: Negative for appetite change and unexpected weight change.  HENT: Positive for congestion (mild in morning) and ear pain.   Cardiovascular: Positive for palpitations (occ, not change). Negative for chest pain.  Neurological: Negative for dizziness, light-headedness and headaches.       Objective:   Vitals:   05/11/18 1455  BP: 114/82  Pulse: 69  Resp: 16  Temp: 97.9 F (36.6 C)  SpO2: 98%   BP Readings from Last 3 Encounters:  05/11/18 114/82  01/20/18 102/72  01/16/17 122/84   Wt Readings from Last 3 Encounters:  05/11/18 192 lb (87.1 kg)  01/20/18 191 lb (86.6 kg)  01/16/17 195 lb (88.5 kg)   Body mass index is 28.35 kg/m.   Physical Exam     Constitutional: Appears well-developed and well-nourished. No distress.  HENT:  Head: Normocephalic and atraumatic.  Neck: Moderate cerumen right ear canal, partial visualization of tympanic membrane-normal; left ear canal and tympanic membrane normal, neck supple. No tracheal deviation present.  No thyromegaly present.  No cervical lymphadenopathy Cardiovascular: Normal rate, regular rhythm and normal heart sounds.   No murmur heard.  Pulmonary/Chest: Effort normal and breath sounds normal. No respiratory distress. No has no wheezes. No rales.  Psychiatric: Normal mood and affect. Behavior is normal.      Assessment & Plan:    See Problem List for Assessment and Plan of chronic medical problems.

## 2018-05-09 NOTE — Patient Instructions (Addendum)
   Medications reviewed and updated.  No changes recommended at this time.  Try the hydrogen peroxide in your right ear and start using the nasal spray.  The ear looks good other than wax.    Please followup in 6 months

## 2018-05-11 ENCOUNTER — Encounter: Payer: Self-pay | Admitting: Internal Medicine

## 2018-05-11 ENCOUNTER — Ambulatory Visit (INDEPENDENT_AMBULATORY_CARE_PROVIDER_SITE_OTHER): Payer: Managed Care, Other (non HMO) | Admitting: Internal Medicine

## 2018-05-11 VITALS — BP 114/82 | HR 69 | Temp 97.9°F | Resp 16 | Wt 192.0 lb

## 2018-05-11 DIAGNOSIS — H9201 Otalgia, right ear: Secondary | ICD-10-CM

## 2018-05-11 DIAGNOSIS — F329 Major depressive disorder, single episode, unspecified: Secondary | ICD-10-CM | POA: Diagnosis not present

## 2018-05-11 DIAGNOSIS — F32A Depression, unspecified: Secondary | ICD-10-CM

## 2018-05-11 DIAGNOSIS — F419 Anxiety disorder, unspecified: Secondary | ICD-10-CM | POA: Diagnosis not present

## 2018-05-11 DIAGNOSIS — F988 Other specified behavioral and emotional disorders with onset usually occurring in childhood and adolescence: Secondary | ICD-10-CM | POA: Diagnosis not present

## 2018-05-11 NOTE — Assessment & Plan Note (Signed)
Intermittent achiness and feeling of fluid in the ear, no pain Moderate cerumen, no erythema She will continue to work on flushing it out with hydrogen peroxide at home Start Flonase nasal spray-discomfort may be related to allergies If no improvement can refer to ENT

## 2018-05-11 NOTE — Assessment & Plan Note (Signed)
Controlled, stable Continue current dose of medication  

## 2018-05-11 NOTE — Assessment & Plan Note (Addendum)
Tolerating Adderall-no side effects Doses effective Not taking it daily Blood pressure controlled Continue current dose of medication-not due for refill Follow-up in 6 months

## 2018-06-02 ENCOUNTER — Encounter: Payer: Self-pay | Admitting: Internal Medicine

## 2018-06-03 MED ORDER — LEVOTHYROXINE SODIUM 25 MCG PO TABS: ORAL_TABLET | ORAL | 1 refills | Status: DC

## 2018-07-06 ENCOUNTER — Other Ambulatory Visit: Payer: Self-pay | Admitting: Internal Medicine

## 2018-07-06 MED ORDER — AMPHETAMINE-DEXTROAMPHET ER 10 MG PO CP24: 10.0000 mg | ORAL_CAPSULE | Freq: Every day | ORAL | 0 refills | Status: DC

## 2018-07-06 NOTE — Telephone Encounter (Signed)
[place] Controlled Substance Database checked. Last filled on 04/26/18 

## 2018-08-22 ENCOUNTER — Other Ambulatory Visit: Payer: Self-pay | Admitting: Internal Medicine

## 2018-08-31 ENCOUNTER — Other Ambulatory Visit: Payer: Self-pay | Admitting: Internal Medicine

## 2018-08-31 MED ORDER — AMPHETAMINE-DEXTROAMPHET ER 10 MG PO CP24: 10.0000 mg | ORAL_CAPSULE | Freq: Every day | ORAL | 0 refills | Status: DC

## 2018-08-31 NOTE — Telephone Encounter (Signed)
MD approved and sent to [email or web address] 

## 2018-08-31 NOTE — Telephone Encounter (Signed)
Check [place] registry last filled 07/06/2018.Marland Kitchen.Raechel Chute/lmb

## 2018-09-16 ENCOUNTER — Other Ambulatory Visit: Payer: Self-pay | Admitting: Internal Medicine

## 2018-11-09 NOTE — Progress Notes (Signed)
Subjective:    Patient ID: Melanie Castaneda, female    DOB: Jan 28, 1976, 42 y.o.   MRN: [id number]  HPI The patient is here for follow up.  Hypothyroidism:  She is taking her medication daily.  She denies any recent changes in energy.  She has gained weight and is not sure why.  She is exercising 4-5 times a week. She knows she could eat better, but wonders if her thyroid is off.    ADD:  She is taking her medication as needed only and never takes it on the weekends.  She feels the medication is effective.  She denies side effects, including palpitations, headaches, lightheadedness, decreased appetite and weight loss.   Anxiety, Depression: She is taking her medication daily as prescribed. She denies any side effects from the medication. She feels her anxiety is well controlled.  She is thinking about trying to wean off the celexa.     Medications and allergies reviewed with patient and updated if appropriate.  Patient Active Problem List   Diagnosis Date Noted  . Discomfort of right ear 05/11/2018  . ADD (attention deficit disorder) 01/20/2018  . Hypothyroidism 01/19/2018  . Anxiety and depression 01/16/2016  . 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 amphetamine-dextroamphetamine (ADDERALL XR) 10 MG 24 hr capsule Take 1 capsule (10 mg total) by mouth daily. 30 capsule 0  . Calcium Carb-Cholecalciferol (CALCIUM 600/VITAMIN D3) 600-800 MG-UNIT TABS Take 1 tablet by mouth daily. 60 tablet   . citalopram (CELEXA) 10 MG tablet TAKE 1 TABLET (10 MG TOTAL) BY MOUTH DAILY. 90 tablet 1  . clonazePAM (KLONOPIN) 0.5 MG tablet Take 1 tablet (0.5 mg total) by mouth at bedtime as needed. 30 tablet 0  . EPINEPHrine (EPIPEN 2-PAK) 0.3 mg/0.3 mL IJ SOAJ injection Inject 0.3 mLs (0.3 mg total) into the muscle once. May repeat if needed 1 Device 1  . levothyroxine (SYNTHROID, LEVOTHROID) 25 MCG tablet TAKE 1  TABLET DAILY BEFORE  BREAKFAST EXCEPT TAKE ONE  AND ONE-HALF TABLETS ON  TUESDAY, THURSDAY, AND  SATURDAY 111 tablet 1  . Multiple Vitamins-Minerals (MULTIVITAMIN) tablet Take 1 tablet by mouth daily.     No current facility-administered medications on file prior to visit.     Past Medical History:  Diagnosis Date  . Anxiety disorder    on SSRI with benefit  . Nontoxic multinodular goiter    S/P nodule aspiration X 1    Past Surgical History:  Procedure Laterality Date  . Epidermoid cystectomy  2010    Dr Harlow Asa  . Thyroid Nodule Aspiration  2008   benign nodular goiter  . WISDOM TOOTH EXTRACTION      Social History   Socioeconomic History  . Marital status: Married    Spouse name: Not on file  . Number of children: Not on file  . Years of education: Not on file  . Highest education level: Not on file  Occupational History  . Not on file  Social Needs  . Financial resource strain: Not on file  . Food insecurity:    Worry: Not on file    Inability: Not on file  . Transportation needs:    Medical: Not on file    Non-medical: Not on file  Tobacco Use  . Smoking status: Former Smoker    Packs/day: 0.50    Start date: 01/10/2013  . Smokeless tobacco: Former Systems developer  Quit date: 01/08/2013  . Tobacco comment: smoked [phone], up to 1/2 ppd  Substance and Sexual Activity  . Alcohol use: Yes    Alcohol/week: 3.0 standard drinks    Types: 3 Glasses of wine per week    Comment: Socially; 1X/ week  . Drug use: No  . Sexual activity: Not on file  Lifestyle  . Physical activity:    Days per week: Not on file    Minutes per session: Not on file  . Stress: Not on file  Relationships  . Social connections:    Talks on phone: Not on file    Gets together: Not on file    Attends religious service: Not on file    Active member of club or organization: Not on file    Attends meetings of clubs or organizations: Not on file    Relationship status: Not on file  Other Topics  Concern  . Not on file  Social History Narrative   Exercise:  regular    Family History  Problem Relation Age of Onset  . Hyperlipidemia Father        Her advanced cholesterol panel (MMR lipoprotein) reveals that LDLDL  goal is less than 120.  . Barrett's esophagus Father   . Bipolar disorder Sister   . Hyperlipidemia Paternal Grandmother   . Thyroid disease Paternal Grandmother        hyperthyroidism  . Breast cancer Paternal Grandmother   . Lung cancer Paternal Grandfather        smoker  . Thyroid disease Paternal Grandfather   . Heart attack Paternal Grandfather        in 29s  . Diabetes Maternal Grandfather   . Diabetes Other        MGGM  . Breast cancer Other        MGGM  . Breast cancer Other        PGGM  . Thyroid disease Unknown        PG aunt  . Stroke Neg Hx     Review of Systems  Constitutional: Negative for appetite change, chills, fever and unexpected weight change.  Respiratory: Negative for cough, shortness of breath and wheezing.   Cardiovascular: Negative for chest pain, palpitations and leg swelling.  Neurological: Negative for light-headedness and headaches.       Objective:   Vitals:   11/10/18 1542  BP: 118/70  Pulse: 78  Resp: 16  Temp: 98.1 F (36.7 C)  SpO2: 96%   BP Readings from Last 3 Encounters:  11/10/18 118/70  05/11/18 114/82  01/20/18 102/72   Wt Readings from Last 3 Encounters:  11/10/18 197 lb (89.4 kg)  05/11/18 192 lb (87.1 kg)  01/20/18 191 lb (86.6 kg)   Body mass index is 29.09 kg/m.   Physical Exam    Constitutional: Appears well-developed and well-nourished. No distress.  HENT:  Head: Normocephalic and atraumatic.  Neck: Neck supple. No tracheal deviation present. No thyromegaly present.  No cervical lymphadenopathy Cardiovascular: Normal rate, regular rhythm and normal heart sounds.   No murmur heard. No carotid bruit .  No edema Pulmonary/Chest: Effort normal and breath sounds normal. No respiratory  distress. No has no wheezes. No rales.  Skin: Skin is warm and dry. Not diaphoretic.  Psychiatric: Normal mood and affect. Behavior is normal.      Assessment & Plan:    See Problem List for Assessment and Plan of chronic medical problems.

## 2018-11-10 ENCOUNTER — Encounter: Payer: Self-pay | Admitting: Internal Medicine

## 2018-11-10 ENCOUNTER — Other Ambulatory Visit (INDEPENDENT_AMBULATORY_CARE_PROVIDER_SITE_OTHER): Payer: Managed Care, Other (non HMO)

## 2018-11-10 ENCOUNTER — Ambulatory Visit (INDEPENDENT_AMBULATORY_CARE_PROVIDER_SITE_OTHER): Payer: Managed Care, Other (non HMO) | Admitting: Internal Medicine

## 2018-11-10 VITALS — BP 118/70 | HR 78 | Temp 98.1°F | Resp 16 | Ht 69.0 in | Wt 197.0 lb

## 2018-11-10 DIAGNOSIS — F329 Major depressive disorder, single episode, unspecified: Secondary | ICD-10-CM

## 2018-11-10 DIAGNOSIS — Z23 Encounter for immunization: Secondary | ICD-10-CM | POA: Diagnosis not present

## 2018-11-10 DIAGNOSIS — F988 Other specified behavioral and emotional disorders with onset usually occurring in childhood and adolescence: Secondary | ICD-10-CM | POA: Diagnosis not present

## 2018-11-10 DIAGNOSIS — F419 Anxiety disorder, unspecified: Secondary | ICD-10-CM | POA: Diagnosis not present

## 2018-11-10 DIAGNOSIS — F32A Depression, unspecified: Secondary | ICD-10-CM

## 2018-11-10 DIAGNOSIS — E038 Other specified hypothyroidism: Secondary | ICD-10-CM | POA: Diagnosis not present

## 2018-11-10 LAB — COMPREHENSIVE METABOLIC PANEL
ALT: 12 U/L (ref 0–35)
AST: 14 U/L (ref 0–37)
Albumin: 4.5 g/dL (ref 3.5–5.2)
Alkaline Phosphatase: 54 U/L (ref 39–117)
BUN: 11 mg/dL (ref 6–23)
CHLORIDE: 106 meq/L (ref 96–112)
CO2: 25 meq/L (ref 19–32)
Calcium: 9.3 mg/dL (ref 8.4–10.5)
Creatinine, Ser: 0.84 mg/dL (ref 0.40–1.20)
GFR: 79.77 mL/min (ref >60.00)
GLUCOSE: 102 mg/dL — AB (ref 70–99)
POTASSIUM: 4.3 meq/L (ref 3.5–5.1)
Sodium: 139 mEq/L (ref 135–145)
Total Bilirubin: 0.3 mg/dL (ref 0.2–1.2)
Total Protein: 7.8 g/dL (ref 6.0–8.3)

## 2018-11-10 LAB — TSH: TSH: 0.68 u[IU]/mL (ref 0.35–4.50)

## 2018-11-10 NOTE — Assessment & Plan Note (Addendum)
Has had some weight gain - ? Related to eating too much or thyroid Will check tsh, cmp

## 2018-11-10 NOTE — Patient Instructions (Signed)
  Tests ordered today. Your results will be released to MyChart (or called to you) after review, usually within 72hours after test completion. If any changes need to be made, you will be notified at that same time.  Flu immunization administered today.    Medications reviewed and updated.  Changes include :   none    Please followup in 6 months   

## 2018-11-10 NOTE — Assessment & Plan Note (Addendum)
She takes adderall as needed during the week only  Controlled, stable Continue current dose of medication as needed only

## 2018-11-10 NOTE — Assessment & Plan Note (Signed)
Controlled, stable Continue current dose of medication - she may try to taper off the celexa and see how she does cmp

## 2018-12-07 ENCOUNTER — Other Ambulatory Visit: Payer: Self-pay | Admitting: Internal Medicine

## 2018-12-20 ENCOUNTER — Other Ambulatory Visit: Payer: Self-pay | Admitting: Internal Medicine

## 2018-12-20 MED ORDER — AMPHETAMINE-DEXTROAMPHET ER 10 MG PO CP24: 10.0000 mg | ORAL_CAPSULE | Freq: Every day | ORAL | 0 refills | Status: DC

## 2018-12-20 NOTE — Telephone Encounter (Signed)
Last OV was 11/10/18 Next OV 04/26/19 Last RF 08/31/18

## 2019-01-31 ENCOUNTER — Other Ambulatory Visit: Payer: Self-pay | Admitting: Internal Medicine

## 2019-02-01 ENCOUNTER — Encounter: Payer: Self-pay | Admitting: Internal Medicine

## 2019-02-01 MED ORDER — AMPHETAMINE-DEXTROAMPHET ER 10 MG PO CP24: 10.0000 mg | ORAL_CAPSULE | Freq: Every day | ORAL | 0 refills | Status: DC

## 2019-02-01 NOTE — Telephone Encounter (Signed)
Check [place] registry last filled 12/20/2018.Marland KitchenRaechel Castaneda

## 2019-02-02 MED ORDER — AMPHETAMINE-DEXTROAMPHET ER 20 MG PO CP24: 20.0000 mg | ORAL_CAPSULE | ORAL | 0 refills | Status: DC

## 2019-03-24 ENCOUNTER — Other Ambulatory Visit: Payer: Self-pay | Admitting: Internal Medicine

## 2019-03-24 MED ORDER — AMPHETAMINE-DEXTROAMPHET ER 20 MG PO CP24: 20.0000 mg | ORAL_CAPSULE | ORAL | 0 refills | Status: DC

## 2019-03-24 NOTE — Telephone Encounter (Signed)
Last refill was 02/02/19 Last OV was 11/10/18 Next OV 04/26/19

## 2019-04-19 ENCOUNTER — Encounter: Payer: Self-pay | Admitting: Internal Medicine

## 2019-04-25 ENCOUNTER — Encounter: Payer: Self-pay | Admitting: Internal Medicine

## 2019-04-25 DIAGNOSIS — R7303 Prediabetes: Secondary | ICD-10-CM | POA: Insufficient documentation

## 2019-04-25 NOTE — Progress Notes (Signed)
Subjective:    Patient ID: Melanie Castaneda, female    DOB: 1976/07/19, 43 y.o.   MRN: [id number]  HPI She is here for a physical exam.   She denies any changes in her health and has no concerns.   She is taking her medication and feels they are all working well.    Medications and allergies reviewed with patient and updated if appropriate.  Patient Active Problem List   Diagnosis Date Noted  . Hyperglycemia 04/25/2019  . Discomfort of right ear 05/11/2018  . ADD (attention deficit disorder) 01/20/2018  . Hypothyroidism 01/19/2018  . Anxiety and depression 01/16/2016  . 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 amphetamine-dextroamphetamine (ADDERALL XR) 20 MG 24 hr capsule Take 1 capsule (20 mg total) by mouth every morning. 30 capsule 0  . Calcium Carb-Cholecalciferol (CALCIUM 600/VITAMIN D3) 600-800 MG-UNIT TABS Take 1 tablet by mouth daily. 60 tablet   . citalopram (CELEXA) 10 MG tablet TAKE 1 TABLET (10 MG TOTAL) BY MOUTH DAILY. 90 tablet 1  . clonazePAM (KLONOPIN) 0.5 MG tablet Take 1 tablet (0.5 mg total) by mouth at bedtime as needed. 30 tablet 0  . EPINEPHrine (EPIPEN 2-PAK) 0.3 mg/0.3 mL IJ SOAJ injection Inject 0.3 mLs (0.3 mg total) into the muscle once. May repeat if needed 1 Device 1  . levothyroxine (SYNTHROID, LEVOTHROID) 25 MCG tablet PLEASE SEE ATTACHED FOR DETAILED DIRECTIONS 102 tablet 1  . Multiple Vitamins-Minerals (MULTIVITAMIN) tablet Take 1 tablet by mouth daily.     No current facility-administered medications on file prior to visit.     Past Medical History:  Diagnosis Date  . Anxiety disorder    on SSRI with benefit  . Nontoxic multinodular goiter    S/P nodule aspiration X 1    Past Surgical History:  Procedure Laterality Date  . Epidermoid cystectomy  2010    Dr Harlow Asa  . Thyroid Nodule Aspiration  2008   benign nodular goiter  . WISDOM TOOTH  EXTRACTION      Social History   Socioeconomic History  . Marital status: Married    Spouse name: Not on file  . Number of children: Not on file  . Years of education: Not on file  . Highest education level: Not on file  Occupational History  . Not on file  Social Needs  . Financial resource strain: Not on file  . Food insecurity:    Worry: Not on file    Inability: Not on file  . Transportation needs:    Medical: Not on file    Non-medical: Not on file  Tobacco Use  . Smoking status: Former Smoker    Packs/day: 0.50    Start date: 01/10/2013  . Smokeless tobacco: Former Systems developer    Quit date: 01/08/2013  . Tobacco comment: smoked [phone], up to 1/2 ppd  Substance and Sexual Activity  . Alcohol use: Yes    Alcohol/week: 3.0 standard drinks    Types: 3 Glasses of wine per week    Comment: Socially; 1X/ week  . Drug use: No  . Sexual activity: Not on file  Lifestyle  . Physical activity:    Days per week: Not on file    Minutes per session: Not on file  . Stress: Not on file  Relationships  . Social connections:    Talks on phone: Not on file    Gets together: Not on  file    Attends religious service: Not on file    Active member of club or organization: Not on file    Attends meetings of clubs or organizations: Not on file    Relationship status: Not on file  Other Topics Concern  . Not on file  Social History Narrative   Exercise:  regular    Family History  Problem Relation Age of Onset  . Hyperlipidemia Father        Her advanced cholesterol panel (MMR lipoprotein) reveals that LDLDL  goal is less than 120.  . Barrett's esophagus Father   . Bipolar disorder Sister   . Hyperlipidemia Paternal Grandmother   . Thyroid disease Paternal Grandmother        hyperthyroidism  . Breast cancer Paternal Grandmother   . Lung cancer Paternal Grandfather        smoker  . Thyroid disease Paternal Grandfather   . Heart attack Paternal Grandfather        in 68s  .  Diabetes Maternal Grandfather   . Diabetes Other        MGGM  . Breast cancer Other        MGGM  . Breast cancer Other        PGGM  . Thyroid disease Other        PG aunt  . Stroke Neg Hx     Review of Systems  Constitutional: Negative for chills and fever.  Eyes: Negative for visual disturbance.  Respiratory: Negative for cough, shortness of breath and wheezing.   Cardiovascular: Negative for chest pain, palpitations and leg swelling.  Gastrointestinal: Negative for abdominal pain, blood in stool, constipation, diarrhea and nausea.       No gerd  Genitourinary: Negative for dysuria and hematuria.  Musculoskeletal: Negative for arthralgias and back pain.  Skin: Negative for color change and rash.  Neurological: Negative for dizziness, light-headedness and headaches.  Psychiatric/Behavioral: Positive for dysphoric mood. The patient is nervous/anxious.        Objective:   Vitals:   04/26/19 1349  BP: 124/80  Pulse: 81  Resp: 16  Temp: (!) 97.5 F (36.4 C)  SpO2: 98%   Filed Weights   04/26/19 1349  Weight: 193 lb 12.8 oz (87.9 kg)   Body mass index is 28.62 kg/m.  BP Readings from Last 3 Encounters:  04/26/19 124/80  11/10/18 118/70  05/11/18 114/82    Wt Readings from Last 3 Encounters:  04/26/19 193 lb 12.8 oz (87.9 kg)  11/10/18 197 lb (89.4 kg)  05/11/18 192 lb (87.1 kg)     Physical Exam Constitutional: She appears well-developed and well-nourished. No distress.  HENT:  Head: Normocephalic and atraumatic.  Right Ear: External ear normal. Normal ear canal and TM Left Ear: External ear normal.  Normal ear canal and TM Mouth/Throat: Oropharynx is clear and moist.  Eyes: Conjunctivae and EOM are normal.  Neck: Neck supple. No tracheal deviation present. No thyromegaly present. No carotid bruit  Cardiovascular: Normal rate, regular rhythm and normal heart sounds.   No murmur heard.  No edema. Pulmonary/Chest: Effort normal and breath sounds normal. No  respiratory distress. She has no wheezes. She has no rales.  Breast: deferred   Abdominal: Soft. She exhibits no distension. There is no tenderness.  Lymphadenopathy: She has no cervical adenopathy.  Skin: Skin is warm and dry. She is not diaphoretic.  Psychiatric: She has a normal mood and affect. Her behavior is normal.  Assessment & Plan:   Physical exam: Screening blood work ordered Immunizations   Up to date  Mammogram  Has not had one yet - has appt with gyn scheduled Gyn   Has appt schedule within the next month Eye exams  Last appt was a while ago - has glasses - will schedule Exercise  Walking a lot Weight  overweight Skin    no concerns Substance abuse    none  See Problem List for Assessment and Plan of chronic medical problems.   FU in 6 months for ADD

## 2019-04-25 NOTE — Patient Instructions (Addendum)
Tests ordered today. Your results will be released to MyChart (or called to you) after review, usually within 72hours after test completion. If any changes need to be made, you will be notified at that same time.  All other Health Maintenance issues reviewed.   All recommended immunizations and age-appropriate screenings are up-to-date or discussed.  No immunizations administered today.   Medications reviewed and updated.  Changes include :   none   Please followup in 6 months   Health Maintenance, Female Adopting a healthy lifestyle and getting preventive care can go a long way to promote health and wellness. Talk with your health care provider about what schedule of regular examinations is right for you. This is a good chance for you to check in with your provider about disease prevention and staying healthy. In between checkups, there are plenty of things you can do on your own. Experts have done a lot of research about which lifestyle changes and preventive measures are most likely to keep you healthy. Ask your health care provider for more information. Weight and diet Eat a healthy diet  Be sure to include plenty of vegetables, fruits, low-fat dairy products, and lean protein.  Do not eat a lot of foods high in solid fats, added sugars, or salt.  Get regular exercise. This is one of the most important things you can do for your health. ? Most adults should exercise for at least 150 minutes each week. The exercise should increase your heart rate and make you sweat (moderate-intensity exercise). ? Most adults should also do strengthening exercises at least twice a week. This is in addition to the moderate-intensity exercise. Maintain a healthy weight  Body mass index (BMI) is a measurement that can be used to identify possible weight problems. It estimates body fat based on height and weight. Your health care provider can help determine your BMI and help you achieve or maintain a  healthy weight.  For females 20 years of age and older: ? A BMI below 18.5 is considered underweight. ? A BMI of 18.5 to 24.9 is normal. ? A BMI of 25 to 29.9 is considered overweight. ? A BMI of 30 and above is considered obese. Watch levels of cholesterol and blood lipids  You should start having your blood tested for lipids and cholesterol at 41 years of age, then have this test every 5 years.  You may need to have your cholesterol levels checked more often if: ? Your lipid or cholesterol levels are high. ? You are older than 41 years of age. ? You are at high risk for heart disease. Cancer screening Lung Cancer  Lung cancer screening is recommended for adults 55-80 years old who are at high risk for lung cancer because of a history of smoking.  A yearly low-dose CT scan of the lungs is recommended for people who: ? Currently smoke. ? Have quit within the past 15 years. ? Have at least a 30-pack-year history of smoking. A pack year is smoking an average of one pack of cigarettes a day for 1 year.  Yearly screening should continue until it has been 15 years since you quit.  Yearly screening should stop if you develop a health problem that would prevent you from having lung cancer treatment. Breast Cancer  Practice breast self-awareness. This means understanding how your breasts normally appear and feel.  It also means doing regular breast self-exams. Let your health care provider know about any changes, no matter how small.    If you are in your 20s or 30s, you should have a clinical breast exam (CBE) by a health care provider every 1-3 years as part of a regular health exam.  If you are 40 or older, have a CBE every year. Also consider having a breast X-ray (mammogram) every year.  If you have a family history of breast cancer, talk to your health care provider about genetic screening.  If you are at high risk for breast cancer, talk to your health care provider about having  an MRI and a mammogram every year.  Breast cancer gene (BRCA) assessment is recommended for women who have family members with BRCA-related cancers. BRCA-related cancers include: ? Breast. ? Ovarian. ? Tubal. ? Peritoneal cancers.  Results of the assessment will determine the need for genetic counseling and BRCA1 and BRCA2 testing. Cervical Cancer Your health care provider may recommend that you be screened regularly for cancer of the pelvic organs (ovaries, uterus, and vagina). This screening involves a pelvic examination, including checking for microscopic changes to the surface of your cervix (Pap test). You may be encouraged to have this screening done every 3 years, beginning at age 21.  For women ages 30-65, health care providers may recommend pelvic exams and Pap testing every 3 years, or they may recommend the Pap and pelvic exam, combined with testing for human papilloma virus (HPV), every 5 years. Some types of HPV increase your risk of cervical cancer. Testing for HPV may also be done on women of any age with unclear Pap test results.  Other health care providers may not recommend any screening for nonpregnant women who are considered low risk for pelvic cancer and who do not have symptoms. Ask your health care provider if a screening pelvic exam is right for you.  If you have had past treatment for cervical cancer or a condition that could lead to cancer, you need Pap tests and screening for cancer for at least 20 years after your treatment. If Pap tests have been discontinued, your risk factors (such as having a new sexual partner) need to be reassessed to determine if screening should resume. Some women have medical problems that increase the chance of getting cervical cancer. In these cases, your health care provider may recommend more frequent screening and Pap tests. Colorectal Cancer  This type of cancer can be detected and often prevented.  Routine colorectal cancer screening  usually begins at 41 years of age and continues through 41 years of age.  Your health care provider may recommend screening at an earlier age if you have risk factors for colon cancer.  Your health care provider may also recommend using home test kits to check for hidden blood in the stool.  A small camera at the end of a tube can be used to examine your colon directly (sigmoidoscopy or colonoscopy). This is done to check for the earliest forms of colorectal cancer.  Routine screening usually begins at age 50.  Direct examination of the colon should be repeated every 5-10 years through 41 years of age. However, you may need to be screened more often if early forms of precancerous polyps or small growths are found. Skin Cancer  Check your skin from head to toe regularly.  Tell your health care provider about any new moles or changes in moles, especially if there is a change in a mole's shape or color.  Also tell your health care provider if you have a mole that is larger than the   size of a pencil eraser.  Always use sunscreen. Apply sunscreen liberally and repeatedly throughout the day.  Protect yourself by wearing long sleeves, pants, a wide-brimmed hat, and sunglasses whenever you are outside. Heart disease, diabetes, and high blood pressure  High blood pressure causes heart disease and increases the risk of stroke. High blood pressure is more likely to develop in: ? People who have blood pressure in the high end of the normal range (130-139/85-89 mm Hg). ? People who are overweight or obese. ? People who are African American.  If you are 18-39 years of age, have your blood pressure checked every 3-5 years. If you are 40 years of age or older, have your blood pressure checked every year. You should have your blood pressure measured twice-once when you are at a hospital or clinic, and once when you are not at a hospital or clinic. Record the average of the two measurements. To check your  blood pressure when you are not at a hospital or clinic, you can use: ? An automated blood pressure machine at a pharmacy. ? A home blood pressure monitor.  If you are between 55 years and 79 years old, ask your health care provider if you should take aspirin to prevent strokes.  Have regular diabetes screenings. This involves taking a blood sample to check your fasting blood sugar level. ? If you are at a normal weight and have a low risk for diabetes, have this test once every three years after 41 years of age. ? If you are overweight and have a high risk for diabetes, consider being tested at a younger age or more often. Preventing infection Hepatitis B  If you have a higher risk for hepatitis B, you should be screened for this virus. You are considered at high risk for hepatitis B if: ? You were born in a country where hepatitis B is common. Ask your health care provider which countries are considered high risk. ? Your parents were born in a high-risk country, and you have not been immunized against hepatitis B (hepatitis B vaccine). ? You have HIV or AIDS. ? You use needles to inject street drugs. ? You live with someone who has hepatitis B. ? You have had sex with someone who has hepatitis B. ? You get hemodialysis treatment. ? You take certain medicines for conditions, including cancer, organ transplantation, and autoimmune conditions. Hepatitis C  Blood testing is recommended for: ? Everyone born from 1945 through 1965. ? Anyone with known risk factors for hepatitis C. Sexually transmitted infections (STIs)  You should be screened for sexually transmitted infections (STIs) including gonorrhea and chlamydia if: ? You are sexually active and are younger than 41 years of age. ? You are older than 41 years of age and your health care provider tells you that you are at risk for this type of infection. ? Your sexual activity has changed since you were last screened and you are at an  increased risk for chlamydia or gonorrhea. Ask your health care provider if you are at risk.  If you do not have HIV, but are at risk, it may be recommended that you take a prescription medicine daily to prevent HIV infection. This is called pre-exposure prophylaxis (PrEP). You are considered at risk if: ? You are sexually active and do not regularly use condoms or know the HIV status of your partner(s). ? You take drugs by injection. ? You are sexually active with a partner who has HIV.   who has HIV. Talk with your health care provider about whether you are at high risk of being infected with HIV. If you choose to begin PrEP, you should first be tested for HIV. You should then be tested every 3 months for as long as you are taking PrEP. Pregnancy  If you are premenopausal and you may become pregnant, ask your health care provider about preconception counseling.  If you may become pregnant, take 400 to 800 micrograms (mcg) of folic acid every day.  If you want to prevent pregnancy, talk to your health care provider about birth control (contraception). Osteoporosis and menopause  Osteoporosis is a disease in which the bones lose minerals and strength with aging. This can result in serious bone fractures. Your risk for osteoporosis can be identified using a bone density scan.  If you are 76 years of age or older, or if you are at risk for osteoporosis and fractures, ask your health care provider if you should be screened.  Ask your health care provider whether you should take a calcium or vitamin D supplement to lower your risk for osteoporosis.  Menopause may have certain physical symptoms and risks.  Hormone replacement therapy may reduce some of these symptoms and risks. Talk to your health care provider about whether hormone replacement therapy is right for you. Follow these instructions at home:  Schedule regular health, dental, and eye exams.  Stay current with your immunizations.  Do not use  any tobacco products including cigarettes, chewing tobacco, or electronic cigarettes.  If you are pregnant, do not drink alcohol.  If you are breastfeeding, limit how much and how often you drink alcohol.  Limit alcohol intake to no more than 1 drink per day for nonpregnant women. One drink equals 12 ounces of beer, 5 ounces of wine, or 1 ounces of hard liquor.  Do not use street drugs.  Do not share needles.  Ask your health care provider for help if you need support or information about quitting drugs.  Tell your health care provider if you often feel depressed.  Tell your health care provider if you have ever been abused or do not feel safe at home. This information is not intended to replace advice given to you by your health care provider. Make sure you discuss any questions you have with your health care provider. Document Released: 06/09/2011 Document Revised: 05/01/2016 Document Reviewed: 08/28/2015 Elsevier Interactive Patient Education  2019 [employer].

## 2019-04-26 ENCOUNTER — Encounter: Payer: Self-pay | Admitting: Internal Medicine

## 2019-04-26 ENCOUNTER — Ambulatory Visit (INDEPENDENT_AMBULATORY_CARE_PROVIDER_SITE_OTHER): Payer: Managed Care, Other (non HMO) | Admitting: Internal Medicine

## 2019-04-26 ENCOUNTER — Other Ambulatory Visit: Payer: Self-pay

## 2019-04-26 ENCOUNTER — Other Ambulatory Visit (INDEPENDENT_AMBULATORY_CARE_PROVIDER_SITE_OTHER): Payer: Managed Care, Other (non HMO)

## 2019-04-26 VITALS — BP 124/80 | HR 81 | Temp 97.5°F | Resp 16 | Ht 69.0 in | Wt 193.8 lb

## 2019-04-26 DIAGNOSIS — F329 Major depressive disorder, single episode, unspecified: Secondary | ICD-10-CM

## 2019-04-26 DIAGNOSIS — E038 Other specified hypothyroidism: Secondary | ICD-10-CM

## 2019-04-26 DIAGNOSIS — F988 Other specified behavioral and emotional disorders with onset usually occurring in childhood and adolescence: Secondary | ICD-10-CM | POA: Diagnosis not present

## 2019-04-26 DIAGNOSIS — Z0000 Encounter for general adult medical examination without abnormal findings: Secondary | ICD-10-CM | POA: Diagnosis not present

## 2019-04-26 DIAGNOSIS — R739 Hyperglycemia, unspecified: Secondary | ICD-10-CM

## 2019-04-26 DIAGNOSIS — F32A Depression, unspecified: Secondary | ICD-10-CM

## 2019-04-26 DIAGNOSIS — F419 Anxiety disorder, unspecified: Secondary | ICD-10-CM | POA: Diagnosis not present

## 2019-04-26 DIAGNOSIS — N946 Dysmenorrhea, unspecified: Secondary | ICD-10-CM | POA: Insufficient documentation

## 2019-04-26 LAB — CBC WITH DIFFERENTIAL/PLATELET
Basophils Absolute: 0.1 10*3/uL (ref 0.0–0.1)
Basophils Relative: 1 % (ref 0.0–3.0)
Eosinophils Absolute: 0.1 10*3/uL (ref 0.0–0.7)
Eosinophils Relative: 1.1 % (ref 0.0–5.0)
HCT: 42.3 % (ref 36.0–46.0)
Hemoglobin: 14.1 g/dL (ref 12.0–15.0)
Lymphocytes Relative: 35.3 % (ref 12.0–46.0)
Lymphs Abs: 2.9 10*3/uL (ref 0.7–4.0)
MCHC: 33.2 g/dL (ref 30.0–36.0)
MCV: 82.5 fl (ref 78.0–100.0)
Monocytes Absolute: 0.6 10*3/uL (ref 0.1–1.0)
Monocytes Relative: 6.9 % (ref 3.0–12.0)
Neutro Abs: 4.5 10*3/uL (ref 1.4–7.7)
Neutrophils Relative %: 55.7 % (ref 43.0–77.0)
Platelets: 375 10*3/uL (ref 150.0–400.0)
RBC: 5.13 Mil/uL — ABNORMAL HIGH (ref 3.87–5.11)
RDW: 14.2 % (ref 11.5–15.5)
WBC: 8.2 10*3/uL (ref 4.0–10.5)

## 2019-04-26 LAB — COMPREHENSIVE METABOLIC PANEL
ALT: 14 U/L (ref 0–35)
AST: 18 U/L (ref 0–37)
Albumin: 4.5 g/dL (ref 3.5–5.2)
Alkaline Phosphatase: 57 U/L (ref 39–117)
BUN: 9 mg/dL (ref 6–23)
CO2: 30 mEq/L (ref 19–32)
Calcium: 9.2 mg/dL (ref 8.4–10.5)
Chloride: 101 mEq/L (ref 96–112)
Creatinine, Ser: 1.06 mg/dL (ref 0.40–1.20)
GFR: 57.25 mL/min — ABNORMAL LOW (ref >60.00)
Glucose, Bld: 95 mg/dL (ref 70–99)
Potassium: 4 mEq/L (ref 3.5–5.1)
Sodium: 138 mEq/L (ref 135–145)
Total Bilirubin: 0.3 mg/dL (ref 0.2–1.2)
Total Protein: 7.7 g/dL (ref 6.0–8.3)

## 2019-04-26 LAB — LIPID PANEL
Cholesterol: 215 mg/dL — ABNORMAL HIGH (ref 0–200)
HDL: 92.8 mg/dL (ref >39.00)
LDL Cholesterol: 94 mg/dL (ref 0–99)
NonHDL: 122.46
Total CHOL/HDL Ratio: 2
Triglycerides: 140 mg/dL (ref 0.0–149.0)
VLDL: 28 mg/dL (ref 0.0–40.0)

## 2019-04-26 LAB — HEMOGLOBIN A1C: Hgb A1c MFr Bld: 5.8 % (ref 4.6–6.5)

## 2019-04-26 LAB — TSH: TSH: 0.49 u[IU]/mL (ref 0.35–4.50)

## 2019-04-26 NOTE — Assessment & Plan Note (Signed)
Clinically euthyroid Check tsh  Titrate med dose if needed  

## 2019-04-26 NOTE — Assessment & Plan Note (Signed)
Controlled, stable Continue current dose of medication - Adderall XR 20 mg daily FU in 6 months

## 2019-04-26 NOTE — Assessment & Plan Note (Addendum)
Has change the celexa to every other day Controlled, stable Continue current dose of medication

## 2019-04-26 NOTE — Assessment & Plan Note (Signed)
Check a1c Low sugar / carb diet Stressed regular exercise   

## 2019-05-04 ENCOUNTER — Other Ambulatory Visit: Payer: Self-pay | Admitting: Internal Medicine

## 2019-05-04 MED ORDER — CLONAZEPAM 0.5 MG PO TABS: 0.5000 mg | ORAL_TABLET | Freq: Every evening | ORAL | 0 refills | Status: DC | PRN

## 2019-05-04 MED ORDER — AMPHETAMINE-DEXTROAMPHET ER 20 MG PO CP24: 20.0000 mg | ORAL_CAPSULE | ORAL | 0 refills | Status: DC

## 2019-05-04 NOTE — Telephone Encounter (Signed)
Last refill was 09/17/15 by Alwyn Ren

## 2019-05-04 NOTE — Telephone Encounter (Signed)
Last refill 03/24/19 Last routine OV 04/26/19 Next OV 10/27/19

## 2019-05-12 ENCOUNTER — Other Ambulatory Visit: Payer: Self-pay | Admitting: Internal Medicine

## 2019-06-02 LAB — HM PAP SMEAR: HM Pap smear: NORMAL

## 2019-06-21 ENCOUNTER — Encounter: Payer: Self-pay | Admitting: Internal Medicine

## 2019-06-27 ENCOUNTER — Other Ambulatory Visit: Payer: Self-pay | Admitting: Internal Medicine

## 2019-06-27 NOTE — Telephone Encounter (Signed)
Check [place] registry last filled 05/04/2019.Marland KitchenJohny Chess

## 2019-06-28 MED ORDER — AMPHETAMINE-DEXTROAMPHET ER 20 MG PO CP24: 20.0000 mg | ORAL_CAPSULE | ORAL | 0 refills | Status: DC

## 2019-08-06 ENCOUNTER — Other Ambulatory Visit: Payer: Self-pay | Admitting: Internal Medicine

## 2019-08-08 MED ORDER — AMPHETAMINE-DEXTROAMPHET ER 20 MG PO CP24: 20.0000 mg | ORAL_CAPSULE | ORAL | 0 refills | Status: DC

## 2019-08-08 NOTE — Telephone Encounter (Signed)
Last OV 04/26/19 Next OV 10/27/19 Last RF 05/04/19

## 2019-09-12 ENCOUNTER — Other Ambulatory Visit: Payer: Self-pay | Admitting: Internal Medicine

## 2019-09-12 MED ORDER — AMPHETAMINE-DEXTROAMPHET ER 20 MG PO CP24: 20.0000 mg | ORAL_CAPSULE | ORAL | 0 refills | Status: DC

## 2019-09-12 NOTE — Telephone Encounter (Signed)
Last OV 04/26/19 Next OV 10/27/19 Last RF 08/08/19

## 2019-10-05 ENCOUNTER — Other Ambulatory Visit: Payer: Self-pay | Admitting: Internal Medicine

## 2019-10-05 MED ORDER — AMPHETAMINE-DEXTROAMPHET ER 20 MG PO CP24: 20.0000 mg | ORAL_CAPSULE | ORAL | 0 refills | Status: DC

## 2019-10-26 ENCOUNTER — Encounter: Payer: Self-pay | Admitting: Internal Medicine

## 2019-10-26 NOTE — Patient Instructions (Addendum)
  Tests ordered today. Your results will be released to MyChart (or called to you) after review.  If any changes need to be made, you will be notified at that same time.   Flu immunization administered today.    Medications reviewed and updated.  Changes include :   none  Your prescription(s) have been submitted to your pharmacy. Please take as directed and contact our office if you believe you are having problem(s) with the medication(s).    Please followup in 6 months   

## 2019-10-26 NOTE — Progress Notes (Addendum)
Subjective:    Patient ID: Melanie Castaneda, female    DOB: [birth year], 43 y.o.   MRN: [id number]  HPI The patient is here for follow up.  Hypothyroidism:  She is taking her medication daily.  She denies any recent changes in energy or weight that are unexplained.   ADD:  She is taking her medication as prescribed.  She feels the medication is effective.  She denies side effects, including palpitations, headaches, lightheadedness, decreased appetite and weight loss.   Anxiety, Depression: She is taking her medication daily as prescribed. She denies any side effects from the medication. She feels her depression is well controlled and she is happy with her current dose of medication.   Prediabetes:  She is compliant with a low sugar/carbohydrate diet.  She is exercising regularly.   Medications and allergies reviewed with patient and updated if appropriate.  Patient Active Problem List   Diagnosis Date Noted  . Dysmenorrhea 04/26/2019  . Prediabetes 04/25/2019  . ADD (attention deficit disorder) 01/20/2018  . Hypothyroidism 01/19/2018  . Anxiety and depression 01/16/2016  . 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 amphetamine-dextroamphetamine (ADDERALL XR) 20 MG 24 hr capsule Take 1 capsule (20 mg total) by mouth every morning. 30 capsule 0  . Calcium Carb-Cholecalciferol (CALCIUM 600/VITAMIN D3) 600-800 MG-UNIT TABS Take 1 tablet by mouth daily. 60 tablet   . citalopram (CELEXA) 10 MG tablet TAKE 1 TABLET (10 MG TOTAL) BY MOUTH DAILY. 90 tablet 1  . clonazePAM (KLONOPIN) 0.5 MG tablet Take 1 tablet (0.5 mg total) by mouth at bedtime as needed. 15 tablet 0  . EPINEPHrine (EPIPEN 2-PAK) 0.3 mg/0.3 mL IJ SOAJ injection Inject 0.3 mLs (0.3 mg total) into the muscle once. May repeat if needed 1 Device 1  . Multiple Vitamins-Minerals (MULTIVITAMIN) tablet Take 1 tablet by mouth daily.     No  current facility-administered medications on file prior to visit.     Past Medical History:  Diagnosis Date  . Anxiety disorder    on SSRI with benefit  . Nontoxic multinodular goiter    S/P nodule aspiration X 1    Past Surgical History:  Procedure Laterality Date  . Epidermoid cystectomy  2010    Dr Harlow Asa  . Thyroid Nodule Aspiration  2008   benign nodular goiter  . WISDOM TOOTH EXTRACTION      Social History   Socioeconomic History  . Marital status: Married    Spouse name: Not on file  . Number of children: Not on file  . Years of education: Not on file  . Highest education level: Not on file  Occupational History  . Not on file  Social Needs  . Financial resource strain: Not on file  . Food insecurity    Worry: Not on file    Inability: Not on file  . Transportation needs    Medical: Not on file    Non-medical: Not on file  Tobacco Use  . Smoking status: Former Smoker    Packs/day: 0.50    Start date: 01/10/2013  . Smokeless tobacco: Former Systems developer    Quit date: 01/08/2013  . Tobacco comment: smoked [phone], up to 1/2 ppd  Substance and Sexual Activity  . Alcohol use: Yes    Alcohol/week: 3.0 standard drinks    Types: 3 Glasses of wine per week    Comment: Socially; 1X/ week  . Drug use:  No  . Sexual activity: Not on file  Lifestyle  . Physical activity    Days per week: Not on file    Minutes per session: Not on file  . Stress: Not on file  Relationships  . Social Herbalist on phone: Not on file    Gets together: Not on file    Attends religious service: Not on file    Active member of club or organization: Not on file    Attends meetings of clubs or organizations: Not on file    Relationship status: Not on file  Other Topics Concern  . Not on file  Social History Narrative   Exercise:  regular    Family History  Problem Relation Age of Onset  . Hyperlipidemia Father        Her advanced cholesterol panel (MMR lipoprotein) reveals  that LDLDL  goal is less than 120.  . Barrett's esophagus Father   . Bipolar disorder Sister   . Hyperlipidemia Paternal Grandmother   . Thyroid disease Paternal Grandmother        hyperthyroidism  . Breast cancer Paternal Grandmother   . Lung cancer Paternal Grandfather        smoker  . Thyroid disease Paternal Grandfather   . Heart attack Paternal Grandfather        in 14s  . Diabetes Maternal Grandfather   . Diabetes Other        MGGM  . Breast cancer Other        MGGM  . Breast cancer Other        PGGM  . Thyroid disease Other        PG aunt  . Stroke Neg Hx     Review of Systems  Constitutional: Negative for chills, fatigue and fever.  Respiratory: Negative for cough, shortness of breath and wheezing.   Cardiovascular: Negative for chest pain, palpitations and leg swelling.  Neurological: Negative for light-headedness and headaches.       Objective:   Vitals:   10/27/19 0807  BP: 116/80  Pulse: 79  Temp: 97.6 F (36.4 C)  SpO2: 94%   BP Readings from Last 3 Encounters:  10/27/19 116/80  04/26/19 124/80  11/10/18 118/70   Wt Readings from Last 3 Encounters:  10/27/19 181 lb 6.4 oz (82.3 kg)  04/26/19 193 lb 12.8 oz (87.9 kg)  11/10/18 197 lb (89.4 kg)   Body mass index is 26.79 kg/m.   Physical Exam    Constitutional: Appears well-developed and well-nourished. No distress.  HENT:  Head: Normocephalic and atraumatic.  Neck: Neck supple. No tracheal deviation present. No thyromegaly present.  No cervical lymphadenopathy Cardiovascular: Normal rate, regular rhythm and normal heart sounds.   No murmur heard. No carotid bruit .  No edema Pulmonary/Chest: Effort normal and breath sounds normal. No respiratory distress. No has no wheezes. No rales.  Skin: Skin is warm and dry. Not diaphoretic.  Psychiatric: Normal mood and affect. Behavior is normal.      Assessment & Plan:    This visit occurred during the SARS-CoV-2 public health emergency.   Safety protocols were in place, including screening questions prior to the visit, additional usage of staff PPE, and extensive cleaning of exam room while observing appropriate contact time as indicated for disinfecting solutions.     See Problem List for Assessment and Plan of chronic medical problems.

## 2019-10-27 ENCOUNTER — Ambulatory Visit (INDEPENDENT_AMBULATORY_CARE_PROVIDER_SITE_OTHER): Payer: Managed Care, Other (non HMO) | Admitting: Internal Medicine

## 2019-10-27 ENCOUNTER — Encounter: Payer: Self-pay | Admitting: Internal Medicine

## 2019-10-27 ENCOUNTER — Other Ambulatory Visit: Payer: Self-pay

## 2019-10-27 ENCOUNTER — Other Ambulatory Visit (INDEPENDENT_AMBULATORY_CARE_PROVIDER_SITE_OTHER): Payer: Managed Care, Other (non HMO)

## 2019-10-27 VITALS — BP 116/80 | HR 79 | Temp 97.6°F | Ht 69.0 in | Wt 181.4 lb

## 2019-10-27 DIAGNOSIS — R7303 Prediabetes: Secondary | ICD-10-CM

## 2019-10-27 DIAGNOSIS — Z23 Encounter for immunization: Secondary | ICD-10-CM

## 2019-10-27 DIAGNOSIS — F32A Depression, unspecified: Secondary | ICD-10-CM

## 2019-10-27 DIAGNOSIS — F988 Other specified behavioral and emotional disorders with onset usually occurring in childhood and adolescence: Secondary | ICD-10-CM | POA: Diagnosis not present

## 2019-10-27 DIAGNOSIS — F419 Anxiety disorder, unspecified: Secondary | ICD-10-CM

## 2019-10-27 DIAGNOSIS — E038 Other specified hypothyroidism: Secondary | ICD-10-CM

## 2019-10-27 DIAGNOSIS — F329 Major depressive disorder, single episode, unspecified: Secondary | ICD-10-CM

## 2019-10-27 LAB — COMPREHENSIVE METABOLIC PANEL
ALT: 12 U/L (ref 0–35)
AST: 14 U/L (ref 0–37)
Albumin: 4.4 g/dL (ref 3.5–5.2)
Alkaline Phosphatase: 49 U/L (ref 39–117)
BUN: 12 mg/dL (ref 6–23)
CO2: 26 mEq/L (ref 19–32)
Calcium: 9.3 mg/dL (ref 8.4–10.5)
Chloride: 105 mEq/L (ref 96–112)
Creatinine, Ser: 0.84 mg/dL (ref 0.40–1.20)
GFR: 74.7 mL/min (ref >60.00)
Glucose, Bld: 109 mg/dL — ABNORMAL HIGH (ref 70–99)
Potassium: 4.4 mEq/L (ref 3.5–5.1)
Sodium: 139 mEq/L (ref 135–145)
Total Bilirubin: 0.5 mg/dL (ref 0.2–1.2)
Total Protein: 7.4 g/dL (ref 6.0–8.3)

## 2019-10-27 LAB — TSH: TSH: 0.98 u[IU]/mL (ref 0.35–4.50)

## 2019-10-27 LAB — HEMOGLOBIN A1C: Hgb A1c MFr Bld: 5.7 % (ref 4.6–6.5)

## 2019-10-27 MED ORDER — LEVOTHYROXINE SODIUM 25 MCG PO TABS: ORAL_TABLET | ORAL | 3 refills | Status: DC

## 2019-10-27 NOTE — Assessment & Plan Note (Signed)
Clinically euthyroid Check tsh  Titrate med dose if needed  

## 2019-10-27 NOTE — Assessment & Plan Note (Signed)
Taking celexa every 2-3 days Clonazepam rarely Overall controlled Continue above

## 2019-10-27 NOTE — Assessment & Plan Note (Signed)
Check a1c Low sugar / carb diet Stressed regular exercise   

## 2019-10-27 NOTE — Assessment & Plan Note (Signed)
Controlled, stable Continue current dose of medication  

## 2019-11-15 ENCOUNTER — Other Ambulatory Visit: Payer: Self-pay | Admitting: Internal Medicine

## 2019-11-16 MED ORDER — AMPHETAMINE-DEXTROAMPHET ER 20 MG PO CP24: 20.0000 mg | ORAL_CAPSULE | ORAL | 0 refills | Status: DC

## 2019-11-16 NOTE — Telephone Encounter (Signed)
Last OV 10/27/19 Last RF 10/17/19 Next 04/26/20

## 2019-12-14 ENCOUNTER — Encounter: Payer: Self-pay | Admitting: Internal Medicine

## 2019-12-14 ENCOUNTER — Other Ambulatory Visit: Payer: Self-pay | Admitting: Internal Medicine

## 2019-12-15 ENCOUNTER — Other Ambulatory Visit: Payer: Self-pay | Admitting: Internal Medicine

## 2019-12-15 MED ORDER — CITALOPRAM HYDROBROMIDE 10 MG PO TABS: 10.0000 mg | ORAL_TABLET | Freq: Every day | ORAL | 1 refills | Status: DC

## 2019-12-16 MED ORDER — AMPHETAMINE-DEXTROAMPHET ER 20 MG PO CP24: 20.0000 mg | ORAL_CAPSULE | ORAL | 0 refills | Status: DC

## 2019-12-16 NOTE — Telephone Encounter (Signed)
Last OV 10/27/19 Next OV 04/26/20 Last RF 11/16/19

## 2020-01-08 NOTE — Telephone Encounter (Signed)
It wont let me refuse it.  

## 2020-01-09 MED ORDER — AMPHETAMINE-DEXTROAMPHET ER 20 MG PO CP24: 20.0000 mg | ORAL_CAPSULE | ORAL | 0 refills | Status: DC

## 2020-02-17 ENCOUNTER — Other Ambulatory Visit: Payer: Self-pay | Admitting: Internal Medicine

## 2020-02-17 NOTE — Telephone Encounter (Signed)
Check [place] registry last filled 01/14/2020.Marland KitchenRaechel Chute

## 2020-02-18 MED ORDER — AMPHETAMINE-DEXTROAMPHET ER 20 MG PO CP24: 20.0000 mg | ORAL_CAPSULE | ORAL | 0 refills | Status: DC

## 2020-03-12 DIAGNOSIS — Z1231 Encounter for screening mammogram for malignant neoplasm of breast: Secondary | ICD-10-CM | POA: Diagnosis not present

## 2020-03-22 ENCOUNTER — Other Ambulatory Visit: Payer: Self-pay | Admitting: Internal Medicine

## 2020-03-22 ENCOUNTER — Other Ambulatory Visit: Payer: Self-pay | Admitting: Obstetrics and Gynecology

## 2020-03-22 DIAGNOSIS — R928 Other abnormal and inconclusive findings on diagnostic imaging of breast: Secondary | ICD-10-CM

## 2020-03-22 MED ORDER — AMPHETAMINE-DEXTROAMPHET ER 20 MG PO CP24: 20.0000 mg | ORAL_CAPSULE | ORAL | 0 refills | Status: DC

## 2020-03-22 NOTE — Telephone Encounter (Signed)
Check [place] registry last filled 02/18/2020../lmb  

## 2020-04-03 ENCOUNTER — Ambulatory Visit
Admission: RE | Admit: 2020-04-03 | Discharge: 2020-04-03 | Disposition: A | Discharge status: Home / Self Care | Payer: Managed Care, Other (non HMO) | Source: Ambulatory Visit | Attending: Obstetrics and Gynecology | Admitting: Obstetrics and Gynecology

## 2020-04-03 ENCOUNTER — Other Ambulatory Visit: Payer: Self-pay

## 2020-04-03 ENCOUNTER — Other Ambulatory Visit: Payer: Self-pay | Admitting: Obstetrics and Gynecology

## 2020-04-03 DIAGNOSIS — R928 Other abnormal and inconclusive findings on diagnostic imaging of breast: Secondary | ICD-10-CM

## 2020-04-03 DIAGNOSIS — N6311 Unspecified lump in the right breast, upper outer quadrant: Secondary | ICD-10-CM | POA: Diagnosis not present

## 2020-04-03 DIAGNOSIS — N6489 Other specified disorders of breast: Secondary | ICD-10-CM

## 2020-04-03 DIAGNOSIS — N6002 Solitary cyst of left breast: Secondary | ICD-10-CM | POA: Diagnosis not present

## 2020-04-25 NOTE — Progress Notes (Signed)
Subjective:    Patient ID: Melanie Castaneda, female    DOB: 02-08-1976, 44 y.o.   MRN: [id number]  HPI The patient is here for follow up of their chronic medical problems, including hypothyroidism, ADD, anxiety, depression, prediabetes.   She is exercising regularly.   She has lost weight.  She has a biopsy scheduled of her breast for an abnormal finding on imaging.  Medications and allergies reviewed with patient and updated if appropriate.  Patient Active Problem List   Diagnosis Date Noted  . Dysmenorrhea 04/26/2019  . Prediabetes 04/25/2019  . ADD (attention deficit disorder) 01/20/2018  . Hypothyroidism 01/19/2018  . Anxiety and depression 01/16/2016  . 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 amphetamine-dextroamphetamine (ADDERALL XR) 20 MG 24 hr capsule Take 1 capsule (20 mg total) by mouth every morning. 30 capsule 0  . Calcium Carb-Cholecalciferol (CALCIUM 600/VITAMIN D3) 600-800 MG-UNIT TABS Take 1 tablet by mouth daily. 60 tablet   . citalopram (CELEXA) 10 MG tablet Take 1 tablet (10 mg total) by mouth daily. 90 tablet 1  . clonazePAM (KLONOPIN) 0.5 MG tablet Take 1 tablet (0.5 mg total) by mouth at bedtime as needed. 15 tablet 0  . EPINEPHrine (EPIPEN 2-PAK) 0.3 mg/0.3 mL IJ SOAJ injection Inject 0.3 mLs (0.3 mg total) into the muscle once. May repeat if needed 1 Device 1  . levothyroxine (SYNTHROID) 25 MCG tablet Take 1 tab 4 days a week and 1.5 tabs 3 days a week 102 tablet 3  . Multiple Vitamins-Minerals (MULTIVITAMIN) tablet Take 1 tablet by mouth daily.     No current facility-administered medications on file prior to visit.    Past Medical History:  Diagnosis Date  . Anxiety disorder    on SSRI with benefit  . Nontoxic multinodular goiter    S/P nodule aspiration X 1    Past Surgical History:  Procedure Laterality Date  . Epidermoid cystectomy  2010    Dr  Harlow Asa  . Thyroid Nodule Aspiration  2008   benign nodular goiter  . WISDOM TOOTH EXTRACTION      Social History   Socioeconomic History  . Marital status: Married    Spouse name: Not on file  . Number of children: Not on file  . Years of education: Not on file  . Highest education level: Not on file  Occupational History  . Not on file  Tobacco Use  . Smoking status: Former Smoker    Packs/day: 0.50    Start date: 01/10/2013  . Smokeless tobacco: Former Systems developer    Quit date: 01/08/2013  . Tobacco comment: smoked [phone], up to 1/2 ppd  Substance and Sexual Activity  . Alcohol use: Yes    Alcohol/week: 3.0 standard drinks    Types: 3 Glasses of wine per week    Comment: Socially; 1X/ week  . Drug use: No  . Sexual activity: Not on file  Other Topics Concern  . Not on file  Social History Narrative   Exercise:  regular   Social Determinants of Health   Financial Resource Strain:   . Difficulty of Paying Living Expenses:   Food Insecurity:   . Worried About Charity fundraiser in the Last Year:   . Arboriculturist in the Last Year:   Transportation Needs:   . Film/video editor (Medical):   Marland Kitchen Lack of Transportation (Non-Medical):   Physical Activity:   .  Days of Exercise per Week:   . Minutes of Exercise per Session:   Stress:   . Feeling of Stress :   Social Connections:   . Frequency of Communication with Friends and Family:   . Frequency of Social Gatherings with Friends and Family:   . Attends Religious Services:   . Active Member of Clubs or Organizations:   . Attends Club or Organization Meetings:   . Marital Status:     Family History  Problem Relation Age of Onset  . Hyperlipidemia Father        Her advanced cholesterol panel (MMR lipoprotein) reveals that LDLDL  goal is less than 120.  . Barrett's esophagus Father   . Bipolar disorder Sister   . Hyperlipidemia Paternal Grandmother   . Thyroid disease Paternal Grandmother        hyperthyroidism    . Breast cancer Paternal Grandmother   . Lung cancer Paternal Grandfather        smoker  . Thyroid disease Paternal Grandfather   . Heart attack Paternal Grandfather        in 60s  . Diabetes Maternal Grandfather   . Diabetes Other        MGGM  . Breast cancer Other        MGGM  . Breast cancer Other        PGGM  . Thyroid disease Other        PG aunt  . Breast cancer Maternal Aunt   . Breast cancer Paternal Aunt   . Stroke Neg Hx     Review of Systems  Constitutional: Negative for chills and fever.  Respiratory: Negative for cough, shortness of breath and wheezing.   Cardiovascular: Negative for chest pain, palpitations and leg swelling.  Neurological: Negative for dizziness, light-headedness and headaches.       Objective:   Vitals:   04/26/20 0748  BP: 122/70  Pulse: 61  Resp: 16  Temp: 97.9 F (36.6 C)  SpO2: 99%   BP Readings from Last 3 Encounters:  04/26/20 122/70  10/27/19 116/80  04/26/19 124/80   Wt Readings from Last 3 Encounters:  04/26/20 170 lb (77.1 kg)  10/27/19 181 lb 6.4 oz (82.3 kg)  04/26/19 193 lb 12.8 oz (87.9 kg)   Body mass index is 25.1 kg/m.   Physical Exam    Constitutional: Appears well-developed and well-nourished. No distress.  HENT:  Head: Normocephalic and atraumatic.  Neck: Neck supple. No tracheal deviation present. No thyromegaly present.  No cervical lymphadenopathy Cardiovascular: Normal rate, regular rhythm and normal heart sounds.   No murmur heard. No carotid bruit .  No edema Pulmonary/Chest: Effort normal and breath sounds normal. No respiratory distress. No has no wheezes. No rales.  Skin: Skin is warm and dry. Not diaphoretic.  Psychiatric: Normal mood and affect. Behavior is normal.      Assessment & Plan:    See Problem List for Assessment and Plan of chronic medical problems.    This visit occurred during the SARS-CoV-2 public health emergency.  Safety protocols were in place, including screening  questions prior to the visit, additional usage of staff PPE, and extensive cleaning of exam room while observing appropriate contact time as indicated for disinfecting solutions.    

## 2020-04-25 NOTE — Patient Instructions (Addendum)
  Blood work was ordered.     Medications reviewed and updated.  Changes include :   none  Your prescription(s) have been submitted to your pharmacy. Please take as directed and contact our office if you believe you are having problem(s) with the medication(s).   Please followup in 6 months   

## 2020-04-26 ENCOUNTER — Encounter: Payer: Self-pay | Admitting: Internal Medicine

## 2020-04-26 ENCOUNTER — Other Ambulatory Visit: Payer: Self-pay

## 2020-04-26 ENCOUNTER — Ambulatory Visit (INDEPENDENT_AMBULATORY_CARE_PROVIDER_SITE_OTHER): Payer: BC Managed Care – PPO | Admitting: Internal Medicine

## 2020-04-26 VITALS — BP 122/70 | HR 61 | Temp 97.9°F | Resp 16 | Ht 69.0 in | Wt 170.0 lb

## 2020-04-26 DIAGNOSIS — R7303 Prediabetes: Secondary | ICD-10-CM | POA: Diagnosis not present

## 2020-04-26 DIAGNOSIS — F419 Anxiety disorder, unspecified: Secondary | ICD-10-CM

## 2020-04-26 DIAGNOSIS — F988 Other specified behavioral and emotional disorders with onset usually occurring in childhood and adolescence: Secondary | ICD-10-CM

## 2020-04-26 DIAGNOSIS — E038 Other specified hypothyroidism: Secondary | ICD-10-CM | POA: Diagnosis not present

## 2020-04-26 DIAGNOSIS — F32A Depression, unspecified: Secondary | ICD-10-CM

## 2020-04-26 DIAGNOSIS — F329 Major depressive disorder, single episode, unspecified: Secondary | ICD-10-CM

## 2020-04-26 LAB — COMPREHENSIVE METABOLIC PANEL
ALT: 12 U/L (ref 0–35)
AST: 18 U/L (ref 0–37)
Albumin: 4.4 g/dL (ref 3.5–5.2)
Alkaline Phosphatase: 44 U/L (ref 39–117)
BUN: 12 mg/dL (ref 6–23)
CO2: 28 mEq/L (ref 19–32)
Calcium: 9.4 mg/dL (ref 8.4–10.5)
Chloride: 104 mEq/L (ref 96–112)
Creatinine, Ser: 0.83 mg/dL (ref 0.40–1.20)
GFR: 75.55 mL/min (ref >60.00)
Glucose, Bld: 107 mg/dL — ABNORMAL HIGH (ref 70–99)
Potassium: 4.4 mEq/L (ref 3.5–5.1)
Sodium: 134 mEq/L — ABNORMAL LOW (ref 135–145)
Total Bilirubin: 0.5 mg/dL (ref 0.2–1.2)
Total Protein: 7.2 g/dL (ref 6.0–8.3)

## 2020-04-26 LAB — HEMOGLOBIN A1C: Hgb A1c MFr Bld: 5.6 % (ref 4.6–6.5)

## 2020-04-26 LAB — TSH: TSH: 0.92 u[IU]/mL (ref 0.35–4.50)

## 2020-04-26 MED ORDER — AMPHETAMINE-DEXTROAMPHET ER 20 MG PO CP24: 20.0000 mg | ORAL_CAPSULE | ORAL | 0 refills | Status: DC

## 2020-04-26 NOTE — Assessment & Plan Note (Signed)
Chronic  Clinically euthyroid Check tsh  Titrate med dose if needed  

## 2020-04-26 NOTE — Assessment & Plan Note (Signed)
Chronic Controlled, stable Continue current dose of medication celexa 10 mg daily, clonazepam at bedtime as needed

## 2020-04-26 NOTE — Assessment & Plan Note (Signed)
Chronic Check a1c Low sugar / carb diet Stressed regular exercise  

## 2020-04-26 NOTE — Assessment & Plan Note (Signed)
Chronic Controlled, stable Continue current dose of medication adderall 20 mg daily

## 2020-04-30 ENCOUNTER — Ambulatory Visit
Admission: RE | Admit: 2020-04-30 | Discharge: 2020-04-30 | Disposition: A | Discharge status: Home / Self Care | Payer: BC Managed Care – PPO | Source: Ambulatory Visit | Attending: Obstetrics and Gynecology | Admitting: Obstetrics and Gynecology

## 2020-04-30 ENCOUNTER — Other Ambulatory Visit: Payer: Self-pay

## 2020-04-30 DIAGNOSIS — R928 Other abnormal and inconclusive findings on diagnostic imaging of breast: Secondary | ICD-10-CM | POA: Diagnosis not present

## 2020-04-30 DIAGNOSIS — N6489 Other specified disorders of breast: Secondary | ICD-10-CM

## 2020-04-30 DIAGNOSIS — N6012 Diffuse cystic mastopathy of left breast: Secondary | ICD-10-CM | POA: Diagnosis not present

## 2020-05-14 DIAGNOSIS — Z130 Encounter for screening for diseases of the blood and blood-forming organs and certain disorders involving the immune mechanism: Secondary | ICD-10-CM | POA: Diagnosis not present

## 2020-05-14 DIAGNOSIS — Z1389 Encounter for screening for other disorder: Secondary | ICD-10-CM | POA: Diagnosis not present

## 2020-05-14 DIAGNOSIS — Z01419 Encounter for gynecological examination (general) (routine) without abnormal findings: Secondary | ICD-10-CM | POA: Diagnosis not present

## 2020-05-14 DIAGNOSIS — Z6825 Body mass index (BMI) 25.0-25.9, adult: Secondary | ICD-10-CM | POA: Diagnosis not present

## 2020-05-28 ENCOUNTER — Telehealth: Payer: Self-pay

## 2020-05-28 NOTE — Telephone Encounter (Signed)
Received medical records in the mail. Attached were records on the wrong patient. Called and spoke with  [facility] and they are going to fax over requested information (PAP results).

## 2020-05-31 ENCOUNTER — Other Ambulatory Visit: Payer: Self-pay | Admitting: Internal Medicine

## 2020-05-31 ENCOUNTER — Encounter: Payer: Self-pay | Admitting: Internal Medicine

## 2020-05-31 MED ORDER — AMPHETAMINE-DEXTROAMPHET ER 20 MG PO CP24: 20.0000 mg | ORAL_CAPSULE | ORAL | 0 refills | Status: DC

## 2020-05-31 NOTE — Progress Notes (Signed)
Outside notes received. Information abstracted. Notes sent to scan.  

## 2020-07-03 ENCOUNTER — Other Ambulatory Visit: Payer: Self-pay | Admitting: Internal Medicine

## 2020-07-03 MED ORDER — AMPHETAMINE-DEXTROAMPHET ER 20 MG PO CP24: 20.0000 mg | ORAL_CAPSULE | ORAL | 0 refills | Status: DC

## 2020-07-30 ENCOUNTER — Other Ambulatory Visit: Payer: Self-pay | Admitting: Internal Medicine

## 2020-07-30 MED ORDER — AMPHETAMINE-DEXTROAMPHET ER 20 MG PO CP24
20.0000 mg | ORAL_CAPSULE | ORAL | 0 refills | Status: DC
Start: 2020-07-30 — End: 2020-09-05

## 2020-09-05 ENCOUNTER — Other Ambulatory Visit: Payer: Self-pay | Admitting: Internal Medicine

## 2020-09-05 MED ORDER — AMPHETAMINE-DEXTROAMPHET ER 20 MG PO CP24
20.0000 mg | ORAL_CAPSULE | ORAL | 0 refills | Status: DC
Start: 2020-09-05 — End: 2020-09-07

## 2020-09-07 ENCOUNTER — Encounter: Payer: Self-pay | Admitting: Internal Medicine

## 2020-09-07 MED ORDER — AMPHETAMINE-DEXTROAMPHET ER 20 MG PO CP24
20.0000 mg | ORAL_CAPSULE | ORAL | 0 refills | Status: DC
Start: 2020-09-07 — End: 2020-10-09

## 2020-09-07 NOTE — Addendum Note (Signed)
Addended by: Pincus Sanes on: 09/07/2020 09:10 AM   Modules accepted: Orders

## 2020-09-25 ENCOUNTER — Other Ambulatory Visit: Payer: Self-pay | Admitting: Internal Medicine

## 2020-10-02 ENCOUNTER — Other Ambulatory Visit: Payer: Self-pay | Admitting: Obstetrics and Gynecology

## 2020-10-02 DIAGNOSIS — N6489 Other specified disorders of breast: Secondary | ICD-10-CM

## 2020-10-09 ENCOUNTER — Other Ambulatory Visit: Payer: Self-pay | Admitting: Internal Medicine

## 2020-10-09 MED ORDER — AMPHETAMINE-DEXTROAMPHET ER 20 MG PO CP24
20.0000 mg | ORAL_CAPSULE | ORAL | 0 refills | Status: DC
Start: 2020-10-09 — End: 2020-11-03

## 2020-10-25 NOTE — Progress Notes (Signed)
Subjective:    Patient ID: Melanie Castaneda, female    DOB: Nov 20, 1976, 44 y.o.   MRN: [id number]  HPI The patient is here for follow up of their chronic medical problems, including hypothyroidism, ADD, anxiety, depression, prediabetes  She is exercising regularly.      Medications and allergies reviewed with patient and updated if appropriate.  Patient Active Problem List   Diagnosis Date Noted  . Dysmenorrhea 04/26/2019  . Prediabetes 04/25/2019  . ADD (attention deficit disorder) 01/20/2018  . Hypothyroidism 01/19/2018  . Anxiety and depression 01/16/2016  . 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 amphetamine-dextroamphetamine (ADDERALL XR) 20 MG 24 hr capsule Take 1 capsule (20 mg total) by mouth every morning. 30 capsule 0  . Calcium Carb-Cholecalciferol (CALCIUM 600/VITAMIN D3) 600-800 MG-UNIT TABS Take 1 tablet by mouth daily. 60 tablet   . citalopram (CELEXA) 10 MG tablet Take 1 tablet (10 mg total) by mouth daily. 90 tablet 1  . clonazePAM (KLONOPIN) 0.5 MG tablet Take 1 tablet (0.5 mg total) by mouth at bedtime as needed. 15 tablet 0  . EPINEPHrine (EPIPEN 2-PAK) 0.3 mg/0.3 mL IJ SOAJ injection Inject 0.3 mLs (0.3 mg total) into the muscle once. May repeat if needed 1 Device 1  . Influenza Virus Vaccine Split SUSP Afluria 2013-2014 45 mcg (15 mcg x 3)/0.5 mL intramuscular suspension  TO BE ADMINISTERED BY PHARMACIST FOR IMMUNIZATION    . levothyroxine (SYNTHROID) 25 MCG tablet TAKE 1 TAB 4 DAYS A WEEK AND 1.5 TABS 3 DAYS A WEEK 102 tablet 3  . Multiple Vitamins-Minerals (MULTIVITAMIN) tablet Take 1 tablet by mouth daily.     No current facility-administered medications on file prior to visit.    Past Medical History:  Diagnosis Date  . Anxiety disorder    on SSRI with benefit  . Nontoxic multinodular goiter    S/P nodule aspiration X 1    Past Surgical History:   Procedure Laterality Date  . Epidermoid cystectomy  2010    Dr Harlow Asa  . Thyroid Nodule Aspiration  2008   benign nodular goiter  . WISDOM TOOTH EXTRACTION      Social History   Socioeconomic History  . Marital status: Married    Spouse name: Not on file  . Number of children: Not on file  . Years of education: Not on file  . Highest education level: Not on file  Occupational History  . Not on file  Tobacco Use  . Smoking status: Former Smoker    Packs/day: 0.50    Start date: 01/10/2013  . Smokeless tobacco: Former Systems developer    Quit date: 01/08/2013  . Tobacco comment: smoked [phone], up to 1/2 ppd  Substance and Sexual Activity  . Alcohol use: Yes    Alcohol/week: 3.0 standard drinks    Types: 3 Glasses of wine per week    Comment: Socially; 1X/ week  . Drug use: No  . Sexual activity: Not on file  Other Topics Concern  . Not on file  Social History Narrative   Exercise:  regular   Social Determinants of Health   Financial Resource Strain:   . Difficulty of Paying Living Expenses: Not on file  Food Insecurity:   . Worried About Charity fundraiser in the Last Year: Not on file  . Ran Out of Food in the Last Year: Not on file  Transportation Needs:   .  Lack of Transportation (Medical): Not on file  . Lack of Transportation (Non-Medical): Not on file  Physical Activity:   . Days of Exercise per Week: Not on file  . Minutes of Exercise per Session: Not on file  Stress:   . Feeling of Stress : Not on file  Social Connections:   . Frequency of Communication with Friends and Family: Not on file  . Frequency of Social Gatherings with Friends and Family: Not on file  . Attends Religious Services: Not on file  . Active Member of Clubs or Organizations: Not on file  . Attends Archivist Meetings: Not on file  . Marital Status: Not on file    Family History  Problem Relation Age of Onset  . Hyperlipidemia Father        Her advanced cholesterol panel (MMR  lipoprotein) reveals that LDLDL  goal is less than 120.  . Barrett's esophagus Father   . Bipolar disorder Sister   . Hyperlipidemia Paternal Grandmother   . Thyroid disease Paternal Grandmother        hyperthyroidism  . Breast cancer Paternal Grandmother   . Lung cancer Paternal Grandfather        smoker  . Thyroid disease Paternal Grandfather   . Heart attack Paternal Grandfather        in 12s  . Diabetes Maternal Grandfather   . Diabetes Other        MGGM  . Breast cancer Other        MGGM  . Breast cancer Other        PGGM  . Thyroid disease Other        PG aunt  . Breast cancer Maternal Aunt   . Breast cancer Paternal Aunt   . Stroke Neg Hx     Review of Systems  Constitutional: Negative for chills and fever.  Respiratory: Negative for cough, shortness of breath and wheezing.   Cardiovascular: Negative for chest pain, palpitations and leg swelling.  Neurological: Negative for light-headedness and headaches.       Objective:   Vitals:   10/26/20 0746  BP: 128/78  Pulse: 77  Temp: 98.4 F (36.9 C)  SpO2: 99%   BP Readings from Last 3 Encounters:  10/26/20 128/78  04/26/20 122/70  10/27/19 116/80   Wt Readings from Last 3 Encounters:  10/26/20 163 lb (73.9 kg)  04/26/20 170 lb (77.1 kg)  10/27/19 181 lb 6.4 oz (82.3 kg)   Body mass index is 24.07 kg/m.   Physical Exam    Constitutional: Appears well-developed and well-nourished. No distress.  HENT:  Head: Normocephalic and atraumatic.  Neck: Neck supple. No tracheal deviation present. No thyromegaly present.  No cervical lymphadenopathy Cardiovascular: Normal rate, regular rhythm and normal heart sounds.   No murmur heard. No carotid bruit .  No edema Pulmonary/Chest: Effort normal and breath sounds normal. No respiratory distress. No has no wheezes. No rales.  Skin: Skin is warm and dry. Not diaphoretic.  Psychiatric: Normal mood and affect. Behavior is normal.      Assessment & Plan:     See Problem List for Assessment and Plan of chronic medical problems.    This visit occurred during the SARS-CoV-2 public health emergency.  Safety protocols were in place, including screening questions prior to the visit, additional usage of staff PPE, and extensive cleaning of exam room while observing appropriate contact time as indicated for disinfecting solutions.

## 2020-10-25 NOTE — Patient Instructions (Addendum)
   Medications reviewed and updated.  Changes include :   none  Your prescription(s) have been submitted to your pharmacy. Please take as directed and contact our office if you believe you are having problem(s) with the medication(s).    Please followup in 6 months   

## 2020-10-26 ENCOUNTER — Other Ambulatory Visit: Payer: Self-pay

## 2020-10-26 ENCOUNTER — Encounter: Payer: Self-pay | Admitting: Internal Medicine

## 2020-10-26 ENCOUNTER — Ambulatory Visit (INDEPENDENT_AMBULATORY_CARE_PROVIDER_SITE_OTHER): Payer: BC Managed Care – PPO | Admitting: Internal Medicine

## 2020-10-26 VITALS — BP 128/78 | HR 77 | Temp 98.4°F | Ht 69.0 in | Wt 163.0 lb

## 2020-10-26 DIAGNOSIS — F988 Other specified behavioral and emotional disorders with onset usually occurring in childhood and adolescence: Secondary | ICD-10-CM | POA: Diagnosis not present

## 2020-10-26 DIAGNOSIS — E038 Other specified hypothyroidism: Secondary | ICD-10-CM | POA: Diagnosis not present

## 2020-10-26 DIAGNOSIS — F419 Anxiety disorder, unspecified: Secondary | ICD-10-CM | POA: Diagnosis not present

## 2020-10-26 DIAGNOSIS — F32A Depression, unspecified: Secondary | ICD-10-CM

## 2020-10-26 DIAGNOSIS — R7303 Prediabetes: Secondary | ICD-10-CM

## 2020-10-26 NOTE — Assessment & Plan Note (Signed)
Chronic Controlled, stable Continue celexa 10 mg daily, clonazepam 0.5 mg daily prn

## 2020-10-26 NOTE — Assessment & Plan Note (Signed)
Chronic  Clinically euthyroid Currently taking levothyroxine 25 mcg daily 4/week and 37.5 mcg ROW

## 2020-10-26 NOTE — Assessment & Plan Note (Signed)
Chronic Low sugar / carb diet Continue regular exercise   

## 2020-10-26 NOTE — Assessment & Plan Note (Signed)
Chronic Controlled, stable Continue Adderall XR 20 mg daily  

## 2020-11-03 ENCOUNTER — Other Ambulatory Visit: Payer: Self-pay | Admitting: Internal Medicine

## 2020-11-03 MED ORDER — AMPHETAMINE-DEXTROAMPHET ER 20 MG PO CP24
20.0000 mg | ORAL_CAPSULE | ORAL | 0 refills | Status: DC
Start: 2020-11-03 — End: 2020-12-04

## 2020-11-05 ENCOUNTER — Ambulatory Visit
Admission: RE | Admit: 2020-11-05 | Discharge: 2020-11-05 | Disposition: A | Discharge status: Home / Self Care | Payer: BC Managed Care – PPO | Source: Ambulatory Visit | Attending: Obstetrics and Gynecology | Admitting: Obstetrics and Gynecology

## 2020-11-05 ENCOUNTER — Other Ambulatory Visit: Payer: Self-pay

## 2020-11-05 DIAGNOSIS — N6489 Other specified disorders of breast: Secondary | ICD-10-CM

## 2020-11-05 DIAGNOSIS — R928 Other abnormal and inconclusive findings on diagnostic imaging of breast: Secondary | ICD-10-CM | POA: Diagnosis not present

## 2020-11-05 LAB — HM MAMMOGRAPHY

## 2020-11-12 DIAGNOSIS — L814 Other melanin hyperpigmentation: Secondary | ICD-10-CM | POA: Diagnosis not present

## 2020-11-12 DIAGNOSIS — L738 Other specified follicular disorders: Secondary | ICD-10-CM | POA: Diagnosis not present

## 2020-11-12 DIAGNOSIS — L820 Inflamed seborrheic keratosis: Secondary | ICD-10-CM | POA: Diagnosis not present

## 2020-11-12 DIAGNOSIS — D485 Neoplasm of uncertain behavior of skin: Secondary | ICD-10-CM | POA: Diagnosis not present

## 2020-12-04 ENCOUNTER — Other Ambulatory Visit: Payer: Self-pay | Admitting: Internal Medicine

## 2020-12-04 MED ORDER — AMPHETAMINE-DEXTROAMPHET ER 20 MG PO CP24
20.0000 mg | ORAL_CAPSULE | ORAL | 0 refills | Status: DC
Start: 2020-12-04 — End: 2020-12-09

## 2020-12-07 ENCOUNTER — Encounter: Payer: Self-pay | Admitting: Internal Medicine

## 2020-12-09 MED ORDER — AMPHETAMINE-DEXTROAMPHET ER 20 MG PO CP24
20.0000 mg | ORAL_CAPSULE | ORAL | 0 refills | Status: DC
Start: 2020-12-09 — End: 2021-01-02

## 2021-01-02 ENCOUNTER — Other Ambulatory Visit: Payer: Self-pay | Admitting: Internal Medicine

## 2021-01-02 ENCOUNTER — Encounter: Payer: Self-pay | Admitting: Internal Medicine

## 2021-01-02 MED ORDER — AMPHETAMINE-DEXTROAMPHET ER 20 MG PO CP24
20.0000 mg | ORAL_CAPSULE | ORAL | 0 refills | Status: DC
Start: 2021-01-02 — End: 2021-01-31

## 2021-01-03 DIAGNOSIS — Z30430 Encounter for insertion of intrauterine contraceptive device: Secondary | ICD-10-CM | POA: Diagnosis not present

## 2021-01-03 NOTE — Progress Notes (Signed)
Virtual Visit via telephone Note-video was not successful due to her having an iPhone.  Switch to telephone encounter.  I connected with Melanie Castaneda on 01/03/21 at  1:30 PM EST by telephone and verified that I am speaking with the correct person using two identifiers.   I discussed the limitations of evaluation and management by telemedicine and the availability of in person appointments. The patient expressed understanding and agreed to proceed.  Present for the visit:  Myself, Dr Cheryll Cockayne, [facility].  The patient is currently at home and I am in the office.    No referring provider.    History of Present Illness: She is here for an acute visit for sinus symptoms.   Her symptoms started 3-4 days ago.   She is experiencing sinus pressure and nasal congestion.  She has had a little discoloration in her mucus.  She states headaches.  She denies any fevers, cough, wheeze or shortness of breath.  She has tried using neti pot.  This has helped some.   She typically has sinus infections. Denies covid exposure.    Review of Systems  Constitutional: Negative for fever.  HENT: Positive for congestion and sinus pain (teeth pain). Negative for ear pain and sore throat.   Respiratory: Negative for cough, shortness of breath and wheezing.   Neurological: Positive for headaches. Negative for dizziness.      Social History   Socioeconomic History  . Marital status: Married    Spouse name: Not on file  . Number of children: Not on file  . Years of education: Not on file  . Highest education level: Not on file  Occupational History  . Not on file  Tobacco Use  . Smoking status: Former Smoker    Packs/day: 0.50    Start date: 01/10/2013  . Smokeless tobacco: Former Neurosurgeon    Quit date: 01/08/2013  . Tobacco comment: smoked [phone], up to 1/2 ppd  Substance and Sexual Activity  . Alcohol use: Yes    Alcohol/week: 3.0 standard drinks    Types: 3 Glasses of wine per week     Comment: Socially; 1X/ week  . Drug use: No  . Sexual activity: Not on file  Other Topics Concern  . Not on file  Social History Narrative   Exercise:  regular   Social Determinants of Health   Financial Resource Strain: Not on file  Food Insecurity: Not on file  Transportation Needs: Not on file  Physical Activity: Not on file  Stress: Not on file  Social Connections: Not on file     Assessment and Plan:  See Problem List for Assessment and Plan of chronic medical problems.   Follow Up Instructions:    I discussed the assessment and treatment plan with the patient. The patient was provided an opportunity to ask questions and all were answered. The patient agreed with the plan and demonstrated an understanding of the instructions.   The patient was advised to call back or seek an in-person evaluation if the symptoms worsen or if the condition fails to improve as anticipated.  Time spent telephone call: 8 minutes  Pincus Sanes, MD

## 2021-01-04 ENCOUNTER — Telehealth (INDEPENDENT_AMBULATORY_CARE_PROVIDER_SITE_OTHER): Payer: BC Managed Care – PPO | Admitting: Internal Medicine

## 2021-01-04 ENCOUNTER — Encounter: Payer: Self-pay | Admitting: Internal Medicine

## 2021-01-04 DIAGNOSIS — J019 Acute sinusitis, unspecified: Secondary | ICD-10-CM | POA: Insufficient documentation

## 2021-01-04 DIAGNOSIS — J0100 Acute maxillary sinusitis, unspecified: Secondary | ICD-10-CM | POA: Diagnosis not present

## 2021-01-04 MED ORDER — AMOXICILLIN-POT CLAVULANATE 875-125 MG PO TABS: 1.0000 | ORAL_TABLET | Freq: Two times a day (BID) | ORAL | 0 refills | Status: DC

## 2021-01-04 NOTE — Assessment & Plan Note (Signed)
Acute Likely bacterial  Start Augmentin 875-125 mg BID x 10 day Continue neti pot otc cold medications Rest, fluid Call if no improvement

## 2021-01-31 ENCOUNTER — Other Ambulatory Visit: Payer: Self-pay

## 2021-01-31 ENCOUNTER — Other Ambulatory Visit: Payer: Self-pay | Admitting: Internal Medicine

## 2021-01-31 ENCOUNTER — Encounter: Payer: Self-pay | Admitting: Internal Medicine

## 2021-01-31 MED ORDER — AMPHETAMINE-DEXTROAMPHET ER 20 MG PO CP24
20.0000 mg | ORAL_CAPSULE | ORAL | 0 refills | Status: DC
Start: 2021-01-31 — End: 2021-03-05

## 2021-01-31 MED ORDER — LEVOTHYROXINE SODIUM 25 MCG PO TABS
25.0000 ug | ORAL_TABLET | Freq: Every day | ORAL | 2 refills | Status: DC
Start: 2021-01-31 — End: 2021-05-08

## 2021-01-31 NOTE — Progress Notes (Signed)
See pt message

## 2021-03-05 ENCOUNTER — Other Ambulatory Visit: Payer: Self-pay | Admitting: Internal Medicine

## 2021-03-05 MED ORDER — AMPHETAMINE-DEXTROAMPHET ER 20 MG PO CP24
20.0000 mg | ORAL_CAPSULE | ORAL | 0 refills | Status: DC
Start: 2021-03-05 — End: 2021-04-06

## 2021-03-07 DIAGNOSIS — Z30431 Encounter for routine checking of intrauterine contraceptive device: Secondary | ICD-10-CM | POA: Diagnosis not present

## 2021-04-06 ENCOUNTER — Other Ambulatory Visit: Payer: Self-pay | Admitting: Internal Medicine

## 2021-04-08 MED ORDER — AMPHETAMINE-DEXTROAMPHET ER 20 MG PO CP24
20.0000 mg | ORAL_CAPSULE | ORAL | 0 refills | Status: DC
Start: 2021-04-08 — End: 2021-05-13

## 2021-04-24 ENCOUNTER — Other Ambulatory Visit: Payer: Self-pay

## 2021-04-24 NOTE — Progress Notes (Signed)
Subjective:    Patient ID: Melanie Castaneda, female    DOB: 11-Sep-1976, 45 y.o.   MRN: [id number]  HPI The patient is here for follow up of their chronic medical problems, including hypothyroidism, prediabetes, ADD, anxiety, depression  She is exercising regularly.      Medications and allergies reviewed with patient and updated if appropriate.  Patient Active Problem List   Diagnosis Date Noted  . Dysmenorrhea 04/26/2019  . Prediabetes 04/25/2019  . ADD (attention deficit disorder) 01/20/2018  . Hypothyroidism 01/19/2018  . Anxiety and depression 01/16/2016  . Nontoxic multinodular goiter-ultrasound due 06/2022 07/26/2008    Current Outpatient Medications on File Prior to Visit  Medication Sig Dispense Refill  . Calcium Carb-Cholecalciferol (CALCIUM 600/VITAMIN D3) 600-800 MG-UNIT TABS Take 1 tablet by mouth daily. 60 tablet   . citalopram (CELEXA) 10 MG tablet Take 1 tablet (10 mg total) by mouth daily. 90 tablet 1  . clonazePAM (KLONOPIN) 0.5 MG tablet Take 1 tablet (0.5 mg total) by mouth at bedtime as needed. 15 tablet 0  . EPINEPHrine (EPIPEN 2-PAK) 0.3 mg/0.3 mL IJ SOAJ injection Inject 0.3 mLs (0.3 mg total) into the muscle once. May repeat if needed 1 Device 1  . Multiple Vitamins-Minerals (MULTIVITAMIN) tablet Take 1 tablet by mouth daily.     No current facility-administered medications on file prior to visit.    Past Medical History:  Diagnosis Date  . Anxiety disorder    on SSRI with benefit  . Nontoxic multinodular goiter    S/P nodule aspiration X 1    Past Surgical History:  Procedure Laterality Date  . Epidermoid cystectomy  2010    Dr Harlow Asa  . Thyroid Nodule Aspiration  2008   benign nodular goiter  . WISDOM TOOTH EXTRACTION      Social History   Socioeconomic History  . Marital status: Legally Separated    Spouse name: Not on file  . Number of children: Not on file  . Years of education: Not on file  . Highest education level: Not on file   Occupational History  . Not on file  Tobacco Use  . Smoking status: Former    Packs/day: 0.50    Types: Cigarettes    Start date: 01/10/2013  . Smokeless tobacco: Former    Quit date: 01/08/2013  . Tobacco comments:    smoked [phone], up to 1/2 ppd  Substance and Sexual Activity  . Alcohol use: Yes    Alcohol/week: 3.0 standard drinks    Types: 3 Glasses of wine per week    Comment: Socially; 1X/ week  . Drug use: No  . Sexual activity: Not on file  Other Topics Concern  . Not on file  Social History Narrative   Exercise:  regular   Social Determinants of Health   Financial Resource Strain: Not on file  Food Insecurity: Not on file  Transportation Needs: Not on file  Physical Activity: Not on file  Stress: Not on file  Social Connections: Not on file    Family History  Problem Relation Age of Onset  . Hyperlipidemia Father        Her advanced cholesterol panel (MMR lipoprotein) reveals that LDLDL  goal is less than 120.  . Barrett's esophagus Father   . Bipolar disorder Sister   . Hyperlipidemia Paternal Grandmother   . Thyroid disease Paternal Grandmother        hyperthyroidism  . Breast cancer Paternal Grandmother   . Lung cancer Paternal Grandfather  smoker  . Thyroid disease Paternal Grandfather   . Heart attack Paternal Grandfather        in 15s  . Diabetes Maternal Grandfather   . Diabetes Other        MGGM  . Breast cancer Other        MGGM  . Breast cancer Other        PGGM  . Thyroid disease Other        PG aunt  . Breast cancer Maternal Aunt   . Breast cancer Paternal Aunt   . Stroke Neg Hx     Review of Systems     Objective:  There were no vitals filed for this visit. BP Readings from Last 3 Encounters:  05/07/21 120/72  10/26/20 128/78  04/26/20 122/70   Wt Readings from Last 3 Encounters:  05/07/21 149 lb (67.6 kg)  10/26/20 163 lb (73.9 kg)  04/26/20 170 lb (77.1 kg)   There is no height or weight on file to calculate  BMI.   Physical Exam    Constitutional: Appears well-developed and well-nourished. No distress.  HENT:  Head: Normocephalic and atraumatic.  Neck: Neck supple. No tracheal deviation present. No thyromegaly present.  No cervical lymphadenopathy Cardiovascular: Normal rate, regular rhythm and normal heart sounds.   No murmur heard. No carotid bruit .  No edema Pulmonary/Chest: Effort normal and breath sounds normal. No respiratory distress. No has no wheezes. No rales.  Skin: Skin is warm and dry. Not diaphoretic.  Psychiatric: Normal mood and affect. Behavior is normal.      Assessment & Plan:    See Problem List for Assessment and Plan of chronic medical problems.    This visit occurred during the SARS-CoV-2 public health emergency.  Safety protocols were in place, including screening questions prior to the visit, additional usage of staff PPE, and extensive cleaning of exam room while observing appropriate contact time as indicated for disinfecting solutions.    This encounter was created in error - please disregard.

## 2021-04-24 NOTE — Patient Instructions (Signed)
  Blood work was ordered.     Medications changes include :     Your prescription(s) have been submitted to your pharmacy. Please take as directed and contact our office if you believe you are having problem(s) with the medication(s).   A referral was ordered for        Someone from their office will call you to schedule an appointment.    Please followup in 6 months  

## 2021-04-25 ENCOUNTER — Encounter: Payer: BC Managed Care – PPO | Admitting: Internal Medicine

## 2021-04-25 ENCOUNTER — Encounter: Payer: Self-pay | Admitting: Internal Medicine

## 2021-04-25 DIAGNOSIS — F988 Other specified behavioral and emotional disorders with onset usually occurring in childhood and adolescence: Secondary | ICD-10-CM

## 2021-04-25 DIAGNOSIS — F32A Depression, unspecified: Secondary | ICD-10-CM

## 2021-04-25 DIAGNOSIS — R7303 Prediabetes: Secondary | ICD-10-CM

## 2021-04-25 DIAGNOSIS — E038 Other specified hypothyroidism: Secondary | ICD-10-CM

## 2021-05-06 NOTE — Patient Instructions (Addendum)
  Blood work was ordered.     Medications changes include :   none    A thyroid ultrasound was ordered.      Someone will call you to schedule an appointment.    Please followup in 6 months   

## 2021-05-06 NOTE — Progress Notes (Signed)
Subjective:    Patient ID: Melanie Castaneda, female    DOB: 29-Jan-1976, 46 y.o.   MRN: [id number]  HPI The patient is here for follow up of their chronic medical problems, including hypothyroidism, prediabetes, ADD, anxiety, depression  She is exercising regularly.   She is doing well.   Not taking celexa or clonazepam.  Does not feel she needs them.    Medications and allergies reviewed with patient and updated if appropriate.  Patient Active Problem List   Diagnosis Date Noted  . Dysmenorrhea 04/26/2019  . Prediabetes 04/25/2019  . ADD (attention deficit disorder) 01/20/2018  . Hypothyroidism 01/19/2018  . Anxiety and depression 01/16/2016  . 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 amphetamine-dextroamphetamine (ADDERALL XR) 20 MG 24 hr capsule Take 1 capsule (20 mg total) by mouth every morning. 30 capsule 0  . Calcium Carb-Cholecalciferol (CALCIUM 600/VITAMIN D3) 600-800 MG-UNIT TABS Take 1 tablet by mouth daily. 60 tablet   . citalopram (CELEXA) 10 MG tablet Take 1 tablet (10 mg total) by mouth daily. 90 tablet 1  . clonazePAM (KLONOPIN) 0.5 MG tablet Take 1 tablet (0.5 mg total) by mouth at bedtime as needed. 15 tablet 0  . EPINEPHrine (EPIPEN 2-PAK) 0.3 mg/0.3 mL IJ SOAJ injection Inject 0.3 mLs (0.3 mg total) into the muscle once. May repeat if needed 1 Device 1  . levothyroxine (SYNTHROID) 25 MCG tablet Take 1 tablet (25 mcg total) by mouth daily before breakfast. 102 tablet 2  . Multiple Vitamins-Minerals (MULTIVITAMIN) tablet Take 1 tablet by mouth daily.     No current facility-administered medications on file prior to visit.    Past Medical History:  Diagnosis Date  . Anxiety disorder    on SSRI with benefit  . Nontoxic multinodular goiter    S/P nodule aspiration X 1    Past Surgical History:  Procedure Laterality Date  . Epidermoid cystectomy  2010    Dr Harlow Asa   . Thyroid Nodule Aspiration  2008   benign nodular goiter  . WISDOM TOOTH EXTRACTION      Social History   Socioeconomic History  . Marital status: Legally Separated    Spouse name: Not on file  . Number of children: Not on file  . Years of education: Not on file  . Highest education level: Not on file  Occupational History  . Not on file  Tobacco Use  . Smoking status: Former Smoker    Packs/day: 0.50    Start date: 01/10/2013  . Smokeless tobacco: Former Systems developer    Quit date: 01/08/2013  . Tobacco comment: smoked [phone], up to 1/2 ppd  Substance and Sexual Activity  . Alcohol use: Yes    Alcohol/week: 3.0 standard drinks    Types: 3 Glasses of wine per week    Comment: Socially; 1X/ week  . Drug use: No  . Sexual activity: Not on file  Other Topics Concern  . Not on file  Social History Narrative   Exercise:  regular   Social Determinants of Health   Financial Resource Strain: Not on file  Food Insecurity: Not on file  Transportation Needs: Not on file  Physical Activity: Not on file  Stress: Not on file  Social Connections: Not on file    Family History  Problem Relation Age of Onset  . Hyperlipidemia Father        Her advanced cholesterol panel (MMR lipoprotein)  reveals that LDLDL  goal is less than 120.  . Barrett's esophagus Father   . Bipolar disorder Sister   . Hyperlipidemia Paternal Grandmother   . Thyroid disease Paternal Grandmother        hyperthyroidism  . Breast cancer Paternal Grandmother   . Lung cancer Paternal Grandfather        smoker  . Thyroid disease Paternal Grandfather   . Heart attack Paternal Grandfather        in 60s  . Diabetes Maternal Grandfather   . Diabetes Other        MGGM  . Breast cancer Other        MGGM  . Breast cancer Other        PGGM  . Thyroid disease Other        PG aunt  . Breast cancer Maternal Aunt   . Breast cancer Paternal Aunt   . Stroke Neg Hx     Review of Systems  Constitutional: Negative for  chills and fever.  Respiratory: Negative for cough, shortness of breath and wheezing.   Cardiovascular: Negative for chest pain, palpitations and leg swelling.  Neurological: Negative for light-headedness and headaches.       Objective:   Vitals:   05/07/21 1335  BP: 120/72  Pulse: 98  Temp: 98.2 F (36.8 C)  SpO2: 97%   BP Readings from Last 3 Encounters:  05/07/21 120/72  10/26/20 128/78  04/26/20 122/70   Wt Readings from Last 3 Encounters:  05/07/21 149 lb (67.6 kg)  10/26/20 163 lb (73.9 kg)  04/26/20 170 lb (77.1 kg)   Body mass index is 22 kg/m.  Depression screen [facility] 2/9 05/07/2021 04/26/2020 10/27/2019 04/26/2019 11/10/2018  Decreased Interest 0 0 3 0 0  Down, Depressed, Hopeless 0 0 0 0 0  PHQ - 2 Score 0 0 3 0 0  Altered sleeping 0 0 1 - 0  Tired, decreased energy 0 0 1 - 0  Change in appetite 0 0 0 - 0  Feeling bad or failure about yourself  0 0 0 - 0  Trouble concentrating 1 1 1  - 0  Moving slowly or fidgety/restless 0 0 0 - 0  Suicidal thoughts 0 0 0 - 0  PHQ-9 Score 1 1 6  - 0  Difficult doing work/chores Not difficult at all Somewhat difficult Not difficult at all - -    GAD 7 : Generalized Anxiety Score 05/07/2021  Nervous, Anxious, on Edge 0  Control/stop worrying 0  Worry too much - different things 0  Trouble relaxing 1  Restless 1  Easily annoyed or irritable 0  Afraid - awful might happen 0  Total GAD 7 Score 2  Anxiety Difficulty Somewhat difficult       Physical Exam    Constitutional: Appears well-developed and well-nourished. No distress.  HENT:  Head: Normocephalic and atraumatic.  Neck: Neck supple. No tracheal deviation present. Mild thyromegaly present.  No cervical lymphadenopathy Cardiovascular: Normal rate, regular rhythm and normal heart sounds.   No murmur heard. No carotid bruit .  No edema Pulmonary/Chest: Effort normal and breath sounds normal. No respiratory distress. No has no wheezes. No rales.  Skin: Skin is warm  and dry. Not diaphoretic.  Psychiatric: Normal mood and affect. Behavior is normal.      Assessment & Plan:    See Problem List for Assessment and Plan of chronic medical problems.    This visit occurred during the SARS-CoV-2 public health emergency.  Safety protocols were in place, including screening questions prior to the visit, additional usage of staff PPE, and extensive cleaning of exam room while observing appropriate contact time as indicated for disinfecting solutions.

## 2021-05-07 ENCOUNTER — Other Ambulatory Visit: Payer: Self-pay

## 2021-05-07 ENCOUNTER — Ambulatory Visit (INDEPENDENT_AMBULATORY_CARE_PROVIDER_SITE_OTHER): Payer: BC Managed Care – PPO | Admitting: Internal Medicine

## 2021-05-07 ENCOUNTER — Encounter: Payer: Self-pay | Admitting: Internal Medicine

## 2021-05-07 VITALS — BP 120/72 | HR 98 | Temp 98.2°F | Ht 69.0 in | Wt 149.0 lb

## 2021-05-07 DIAGNOSIS — F32A Depression, unspecified: Secondary | ICD-10-CM

## 2021-05-07 DIAGNOSIS — F988 Other specified behavioral and emotional disorders with onset usually occurring in childhood and adolescence: Secondary | ICD-10-CM

## 2021-05-07 DIAGNOSIS — F419 Anxiety disorder, unspecified: Secondary | ICD-10-CM

## 2021-05-07 DIAGNOSIS — R7303 Prediabetes: Secondary | ICD-10-CM | POA: Diagnosis not present

## 2021-05-07 DIAGNOSIS — Z1159 Encounter for screening for other viral diseases: Secondary | ICD-10-CM

## 2021-05-07 DIAGNOSIS — E038 Other specified hypothyroidism: Secondary | ICD-10-CM

## 2021-05-07 DIAGNOSIS — E042 Nontoxic multinodular goiter: Secondary | ICD-10-CM

## 2021-05-07 LAB — HEMOGLOBIN A1C: Hgb A1c MFr Bld: 5.7 % (ref 4.6–6.5)

## 2021-05-07 LAB — COMPREHENSIVE METABOLIC PANEL
ALT: 35 U/L (ref 0–35)
AST: 41 U/L — ABNORMAL HIGH (ref 0–37)
Albumin: 4.5 g/dL (ref 3.5–5.2)
Alkaline Phosphatase: 53 U/L (ref 39–117)
BUN: 10 mg/dL (ref 6–23)
CO2: 28 mEq/L (ref 19–32)
Calcium: 10.1 mg/dL (ref 8.4–10.5)
Chloride: 99 mEq/L (ref 96–112)
Creatinine, Ser: 0.74 mg/dL (ref 0.40–1.20)
GFR: 99.68 mL/min (ref >60.00)
Glucose, Bld: 100 mg/dL — ABNORMAL HIGH (ref 70–99)
Potassium: 4 mEq/L (ref 3.5–5.1)
Sodium: 136 mEq/L (ref 135–145)
Total Bilirubin: 0.5 mg/dL (ref 0.2–1.2)
Total Protein: 7.5 g/dL (ref 6.0–8.3)

## 2021-05-07 LAB — TSH: TSH: 0.32 u[IU]/mL — ABNORMAL LOW (ref 0.35–4.50)

## 2021-05-07 NOTE — Assessment & Plan Note (Signed)
Chronic  Clinically euthyroid Currently taking levothyroxine 25 mcg Check tsh  Titrate med dose if needed  

## 2021-05-07 NOTE — Assessment & Plan Note (Addendum)
Chronic Screening with PHQ9 and GAD7 - negative for depression and mild anxiety - related to being separated from husband Controlled, stable currently not taking celexa 10 mg daily, clonazepam 0.5 mg HS prn - will keep on list for now and remove at her next visit just in case she needs them, but we both agree she will likely not need them

## 2021-05-07 NOTE — Assessment & Plan Note (Signed)
Chronic She denies changes in size tsh Will order [place] to check on nodules

## 2021-05-07 NOTE — Addendum Note (Signed)
Addended by: Waldemar Dickens B on: 05/07/2021 02:17 PM   Modules accepted: Orders

## 2021-05-07 NOTE — Assessment & Plan Note (Signed)
Chronic Controlled, stable Continue Adderall Xr 20 mg daily

## 2021-05-07 NOTE — Assessment & Plan Note (Addendum)
Chronic Check a1c Low sugar / carb diet Continue regular exercise 

## 2021-05-08 ENCOUNTER — Other Ambulatory Visit: Payer: Self-pay | Admitting: Internal Medicine

## 2021-05-08 LAB — HEPATITIS C ANTIBODY
Hepatitis C Ab: NONREACTIVE
SIGNAL TO CUT-OFF: 0.03 (ref <1.00)

## 2021-05-08 MED ORDER — LEVOTHYROXINE SODIUM 25 MCG PO TABS: 25.0000 ug | ORAL_TABLET | Freq: Every day | ORAL | 3 refills | Status: DC

## 2021-05-11 ENCOUNTER — Other Ambulatory Visit: Payer: Self-pay | Admitting: Internal Medicine

## 2021-05-13 ENCOUNTER — Other Ambulatory Visit: Payer: Self-pay | Admitting: Internal Medicine

## 2021-06-11 ENCOUNTER — Other Ambulatory Visit: Payer: Self-pay | Admitting: Internal Medicine

## 2021-06-11 MED ORDER — AMPHETAMINE-DEXTROAMPHET ER 20 MG PO CP24: 20.0000 mg | ORAL_CAPSULE | Freq: Every morning | ORAL | 0 refills | Status: DC

## 2021-06-13 ENCOUNTER — Other Ambulatory Visit: Payer: Self-pay | Admitting: Internal Medicine

## 2021-06-13 MED ORDER — AMPHETAMINE-DEXTROAMPHET ER 20 MG PO CP24: 20.0000 mg | ORAL_CAPSULE | Freq: Every morning | ORAL | 0 refills | Status: DC

## 2021-06-17 ENCOUNTER — Ambulatory Visit
Admission: RE | Admit: 2021-06-17 | Discharge: 2021-06-17 | Disposition: A | Discharge status: Home / Self Care | Payer: BC Managed Care – PPO | Source: Ambulatory Visit | Attending: Internal Medicine | Admitting: Internal Medicine

## 2021-06-17 DIAGNOSIS — E042 Nontoxic multinodular goiter: Secondary | ICD-10-CM | POA: Diagnosis not present

## 2021-06-19 DIAGNOSIS — Z6822 Body mass index (BMI) 22.0-22.9, adult: Secondary | ICD-10-CM | POA: Diagnosis not present

## 2021-06-19 DIAGNOSIS — Z01419 Encounter for gynecological examination (general) (routine) without abnormal findings: Secondary | ICD-10-CM | POA: Diagnosis not present

## 2021-06-19 DIAGNOSIS — Z1389 Encounter for screening for other disorder: Secondary | ICD-10-CM | POA: Diagnosis not present

## 2021-06-19 DIAGNOSIS — Z130 Encounter for screening for diseases of the blood and blood-forming organs and certain disorders involving the immune mechanism: Secondary | ICD-10-CM | POA: Diagnosis not present

## 2021-07-29 ENCOUNTER — Other Ambulatory Visit: Payer: Self-pay | Admitting: Internal Medicine

## 2021-07-29 ENCOUNTER — Encounter: Payer: Self-pay | Admitting: Internal Medicine

## 2021-07-31 MED ORDER — AMPHETAMINE-DEXTROAMPHET ER 20 MG PO CP24: 20.0000 mg | ORAL_CAPSULE | Freq: Every morning | ORAL | 0 refills | Status: DC

## 2021-08-30 ENCOUNTER — Encounter: Payer: Self-pay | Admitting: Internal Medicine

## 2021-08-30 MED ORDER — AMPHETAMINE-DEXTROAMPHET ER 20 MG PO CP24: 20.0000 mg | ORAL_CAPSULE | Freq: Every morning | ORAL | 0 refills | Status: DC

## 2021-09-02 ENCOUNTER — Other Ambulatory Visit: Payer: Self-pay | Admitting: Internal Medicine

## 2021-09-02 MED ORDER — AMPHETAMINE-DEXTROAMPHET ER 20 MG PO CP24: 20.0000 mg | ORAL_CAPSULE | Freq: Every morning | ORAL | 0 refills | Status: DC

## 2021-09-30 ENCOUNTER — Other Ambulatory Visit: Payer: Self-pay | Admitting: Internal Medicine

## 2021-09-30 MED ORDER — AMPHETAMINE-DEXTROAMPHET ER 20 MG PO CP24: 20.0000 mg | ORAL_CAPSULE | Freq: Every morning | ORAL | 0 refills | Status: DC

## 2021-10-29 ENCOUNTER — Other Ambulatory Visit: Payer: Self-pay | Admitting: Internal Medicine

## 2021-10-29 MED ORDER — AMPHETAMINE-DEXTROAMPHET ER 20 MG PO CP24: 20.0000 mg | ORAL_CAPSULE | Freq: Every morning | ORAL | 0 refills | Status: DC

## 2021-11-04 ENCOUNTER — Encounter: Payer: Self-pay | Admitting: Internal Medicine

## 2021-11-04 NOTE — Telephone Encounter (Signed)
See below

## 2021-11-06 ENCOUNTER — Ambulatory Visit: Payer: BC Managed Care – PPO | Admitting: Internal Medicine

## 2021-11-18 ENCOUNTER — Encounter: Payer: Self-pay | Admitting: Internal Medicine

## 2021-11-26 NOTE — Progress Notes (Signed)
Subjective:    Patient ID: Melanie Castaneda, female    DOB: February 11, 1976, 45 y.o.   MRN: [id number]  This visit occurred during the SARS-CoV-2 public health emergency.  Safety protocols were in place, including screening questions prior to the visit, additional usage of staff PPE, and extensive cleaning of exam room while observing appropriate contact time as indicated for disinfecting solutions.     HPI The patient is here for follow up of their chronic medical problems, including hypothyroid, prediabetes, ADD, anxiety, depression  She is exercising regularly.     Medications and allergies reviewed with patient and updated if appropriate.  Patient Active Problem List   Diagnosis Date Noted   Dysmenorrhea 04/26/2019   Prediabetes 04/25/2019   ADD (attention deficit disorder) 01/20/2018   Hypothyroidism 01/19/2018   Nontoxic multinodular goiter-ultrasound due 06/2022 07/26/2008    Current Outpatient Medications on File Prior to Visit  Medication Sig Dispense Refill   Calcium Carb-Cholecalciferol (CALCIUM 600/VITAMIN D3) 600-800 MG-UNIT TABS Take 1 tablet by mouth daily. 60 tablet    EPINEPHrine (EPIPEN 2-PAK) 0.3 mg/0.3 mL IJ SOAJ injection Inject 0.3 mLs (0.3 mg total) into the muscle once. May repeat if needed 1 Device 1   Multiple Vitamins-Minerals (MULTIVITAMIN) tablet Take 1 tablet by mouth daily.     No current facility-administered medications on file prior to visit.    Past Medical History:  Diagnosis Date   Anxiety disorder    on SSRI with benefit   Nontoxic multinodular goiter    S/P nodule aspiration X 1    Past Surgical History:  Procedure Laterality Date   Epidermoid cystectomy  2010    Dr Harlow Asa   Thyroid Nodule Aspiration  2008   benign nodular goiter   WISDOM TOOTH EXTRACTION      Social History   Socioeconomic History   Marital status: Legally Separated    Spouse name: Not on file   Number of children: Not on file   Years of  education: Not on file   Highest education level: Not on file  Occupational History   Not on file  Tobacco Use   Smoking status: Former    Packs/day: 0.50    Types: Cigarettes    Start date: 01/10/2013   Smokeless tobacco: Former    Quit date: 01/08/2013   Tobacco comments:    smoked [id number], up to 1/2 ppd  Substance and Sexual Activity   Alcohol use: Yes    Alcohol/week: 3.0 standard drinks    Types: 3 Glasses of wine per week    Comment: Socially; 1X/ week   Drug use: No   Sexual activity: Not on file  Other Topics Concern   Not on file  Social History Narrative   Exercise:  regular   Social Determinants of Health   Financial Resource Strain: Not on file  Food Insecurity: Not on file  Transportation Needs: Not on file  Physical Activity: Not on file  Stress: Not on file  Social Connections: Not on file    Family History  Problem Relation Age of Onset   Hyperlipidemia Father        Her advanced cholesterol panel (MMR lipoprotein) reveals that LDLDL  goal is less than 120.   Barrett's esophagus Father    Bipolar disorder Sister    Hyperlipidemia Paternal Grandmother    Thyroid disease Paternal Grandmother        hyperthyroidism   Breast cancer Paternal Grandmother    Lung cancer Paternal  Grandfather        smoker   Thyroid disease Paternal Grandfather    Heart attack Paternal Grandfather        in 57s   Diabetes Maternal Grandfather    Diabetes Other        MGGM   Breast cancer Other        MGGM   Breast cancer Other        PGGM   Thyroid disease Other        PG aunt   Breast cancer Maternal Aunt    Breast cancer Paternal Aunt    Stroke Neg Hx     Review of Systems     Objective:  There were no vitals filed for this visit. BP Readings from Last 3 Encounters:  12/13/21 118/76  05/07/21 120/72  10/26/20 128/78   Wt Readings from Last 3 Encounters:  12/13/21 150 lb 9.6 oz (68.3 kg)  05/07/21 149 lb (67.6 kg)  10/26/20  163 lb (73.9 kg)   There is no height or weight on file to calculate BMI.   Physical Exam    Constitutional: Appears well-developed and well-nourished. No distress.  HENT:  Head: Normocephalic and atraumatic.  Neck: Neck supple. No tracheal deviation present. No thyromegaly present.  No cervical lymphadenopathy Cardiovascular: Normal rate, regular rhythm and normal heart sounds.   No murmur heard. No carotid bruit .  No edema Pulmonary/Chest: Effort normal and breath sounds normal. No respiratory distress. No has no wheezes. No rales.  Skin: Skin is warm and dry. Not diaphoretic.  Psychiatric: Normal mood and affect. Behavior is normal.      Assessment & Plan:    See Problem List for Assessment and Plan of chronic medical problems.     This encounter was created in error - please disregard.

## 2021-11-26 NOTE — Patient Instructions (Signed)
  Blood work was ordered.     Medications changes include :     Your prescription(s) have been submitted to your pharmacy. Please take as directed and contact our office if you believe you are having problem(s) with the medication(s).   A referral was ordered for        Someone from their office will call you to schedule an appointment.    Please followup in 6 months  

## 2021-11-27 ENCOUNTER — Other Ambulatory Visit: Payer: Self-pay

## 2021-11-27 ENCOUNTER — Encounter: Payer: BC Managed Care – PPO | Admitting: Internal Medicine

## 2021-12-02 ENCOUNTER — Other Ambulatory Visit: Payer: Self-pay | Admitting: Internal Medicine

## 2021-12-03 MED ORDER — AMPHETAMINE-DEXTROAMPHET ER 20 MG PO CP24: 20.0000 mg | ORAL_CAPSULE | Freq: Every morning | ORAL | 0 refills | Status: DC

## 2021-12-12 ENCOUNTER — Encounter: Payer: Self-pay | Admitting: Internal Medicine

## 2021-12-12 NOTE — Progress Notes (Signed)
Subjective:    Patient ID: Melanie Castaneda, female    DOB: [birth year], 46 y.o.   MRN: [id number]  This visit occurred during the SARS-CoV-2 public health emergency.  Safety protocols were in place, including screening questions prior to the visit, additional usage of staff PPE, and extensive cleaning of exam room while observing appropriate contact time as indicated for disinfecting solutions.     HPI The patient is here for follow up of their chronic medical problems, including hypothyroid, prediabetes, ADD, anxiety, depression  She is exercising regularly.   She is taking all of her medications as prescribed.    She has not taken her anti-depressant or anxiety med for a while. She denies depression/anxiety.  Medications and allergies reviewed with patient and updated if appropriate.  Patient Active Problem List   Diagnosis Date Noted   Dysmenorrhea 04/26/2019   Prediabetes 04/25/2019   ADD (attention deficit disorder) 01/20/2018   Hypothyroidism 01/19/2018   Anxiety and depression 01/16/2016   Nontoxic multinodular goiter-ultrasound due 06/2022 07/26/2008    Current Outpatient Medications on File Prior to Visit  Medication Sig Dispense Refill   amphetamine-dextroamphetamine (ADDERALL XR) 20 MG 24 hr capsule Take 1 capsule (20 mg total) by mouth every morning. 30 capsule 0   Calcium Carb-Cholecalciferol (CALCIUM 600/VITAMIN D3) 600-800 MG-UNIT TABS Take 1 tablet by mouth daily. 60 tablet    EPINEPHrine (EPIPEN 2-PAK) 0.3 mg/0.3 mL IJ SOAJ injection Inject 0.3 mLs (0.3 mg total) into the muscle once. May repeat if needed 1 Device 1   levothyroxine (SYNTHROID) 25 MCG tablet Take 1 tablet (25 mcg total) by mouth daily before breakfast. Take 6 days of week only. 72 tablet 3   Multiple Vitamins-Minerals (MULTIVITAMIN) tablet Take 1 tablet by mouth daily.     levonorgestrel (MIRENA, 52 MG,) 20 MCG/DAY IUD Mirena 20 mcg/24 hours (8 yrs) 52 mg intrauterine device  Take 1 device by  intrauterine route.     No current facility-administered medications on file prior to visit.    Past Medical History:  Diagnosis Date   Anxiety disorder    on SSRI with benefit   Nontoxic multinodular goiter    S/P nodule aspiration X 1    Past Surgical History:  Procedure Laterality Date   Epidermoid cystectomy  2010    Dr Harlow Asa   Thyroid Nodule Aspiration  2008   benign nodular goiter   WISDOM TOOTH EXTRACTION      Social History   Socioeconomic History   Marital status: Legally Separated    Spouse name: Not on file   Number of children: Not on file   Years of education: Not on file   Highest education level: Not on file  Occupational History   Not on file  Tobacco Use   Smoking status: Former    Packs/day: 0.50    Types: Cigarettes    Start date: 01/10/2013   Smokeless tobacco: Former    Quit date: 01/08/2013   Tobacco comments:    smoked [id number], up to 1/2 ppd  Substance and Sexual Activity   Alcohol use: Yes    Alcohol/week: 3.0 standard drinks    Types: 3 Glasses of wine per week    Comment: Socially; 1X/ week   Drug use: No   Sexual activity: Not on file  Other Topics Concern   Not on file  Social History Narrative   Exercise:  regular   Social Determinants of Health   Financial Resource Strain: Not on file  Food Insecurity: Not on file  Transportation Needs: Not on file  Physical Activity: Not on file  Stress: Not on file  Social Connections: Not on file    Family History  Problem Relation Age of Onset   Hyperlipidemia Father        Her advanced cholesterol panel (MMR lipoprotein) reveals that LDLDL  goal is less than 120.   Barrett's esophagus Father    Bipolar disorder Sister    Hyperlipidemia Paternal Grandmother    Thyroid disease Paternal Grandmother        hyperthyroidism   Breast cancer Paternal Grandmother    Lung cancer Paternal Grandfather        smoker   Thyroid disease Paternal Grandfather    Heart attack Paternal  Grandfather        in 69s   Diabetes Maternal Grandfather    Diabetes Other        MGGM   Breast cancer Other        MGGM   Breast cancer Other        PGGM   Thyroid disease Other        PG aunt   Breast cancer Maternal Aunt    Breast cancer Paternal Aunt    Stroke Neg Hx     Review of Systems  Constitutional:  Negative for fever.  HENT:  Negative for trouble swallowing.   Respiratory:  Negative for cough, shortness of breath and wheezing.   Cardiovascular:  Negative for chest pain, palpitations and leg swelling.  Neurological:  Negative for light-headedness and headaches.      Objective:   Vitals:   12/13/21 0805  BP: 118/76  Pulse: 83  Temp: 98.2 F (36.8 C)  SpO2: 98%   BP Readings from Last 3 Encounters:  12/13/21 118/76  05/07/21 120/72  10/26/20 128/78   Wt Readings from Last 3 Encounters:  12/13/21 150 lb 9.6 oz (68.3 kg)  05/07/21 149 lb (67.6 kg)  10/26/20 163 lb (73.9 kg)   Body mass index is 22.24 kg/m.   Physical Exam    Constitutional: Appears well-developed and well-nourished. No distress.  HENT:  Head: Normocephalic and atraumatic.  Neck: Neck supple. No tracheal deviation present. No thyromegaly present.  No cervical lymphadenopathy Cardiovascular: Normal rate, regular rhythm and normal heart sounds.   No murmur heard. No carotid bruit .  No edema Pulmonary/Chest: Effort normal and breath sounds normal. No respiratory distress. No has no wheezes. No rales.  Skin: Skin is warm and dry. Not diaphoretic.  Psychiatric: Normal mood and affect. Behavior is normal.      Assessment & Plan:    See Problem List for Assessment and Plan of chronic medical problems.

## 2021-12-12 NOTE — Patient Instructions (Addendum)
  Blood work was ordered.     Medications changes include :   None  Your prescription(s) have been submitted to your pharmacy. Please take as directed and contact our office if you believe you are having problem(s) with the medication(s).    Please followup in 6 months   

## 2021-12-13 ENCOUNTER — Ambulatory Visit (INDEPENDENT_AMBULATORY_CARE_PROVIDER_SITE_OTHER): Payer: BC Managed Care – PPO | Admitting: Internal Medicine

## 2021-12-13 ENCOUNTER — Other Ambulatory Visit: Payer: Self-pay

## 2021-12-13 VITALS — BP 118/76 | HR 83 | Temp 98.2°F | Ht 69.0 in | Wt 150.6 lb

## 2021-12-13 DIAGNOSIS — R7303 Prediabetes: Secondary | ICD-10-CM | POA: Diagnosis not present

## 2021-12-13 DIAGNOSIS — F988 Other specified behavioral and emotional disorders with onset usually occurring in childhood and adolescence: Secondary | ICD-10-CM | POA: Diagnosis not present

## 2021-12-13 DIAGNOSIS — E039 Hypothyroidism, unspecified: Secondary | ICD-10-CM | POA: Diagnosis not present

## 2021-12-13 DIAGNOSIS — F32A Depression, unspecified: Secondary | ICD-10-CM

## 2021-12-13 LAB — TSH: TSH: 0.53 u[IU]/mL (ref 0.35–5.50)

## 2021-12-13 LAB — COMPREHENSIVE METABOLIC PANEL
ALT: 20 U/L (ref 0–35)
AST: 20 U/L (ref 0–37)
Albumin: 4.3 g/dL (ref 3.5–5.2)
Alkaline Phosphatase: 49 U/L (ref 39–117)
BUN: 12 mg/dL (ref 6–23)
CO2: 27 mEq/L (ref 19–32)
Calcium: 9.4 mg/dL (ref 8.4–10.5)
Chloride: 103 mEq/L (ref 96–112)
Creatinine, Ser: 0.88 mg/dL (ref 0.40–1.20)
GFR: 80.62 mL/min (ref >60.00)
Glucose, Bld: 106 mg/dL — ABNORMAL HIGH (ref 70–99)
Potassium: 4.1 mEq/L (ref 3.5–5.1)
Sodium: 138 mEq/L (ref 135–145)
Total Bilirubin: 0.6 mg/dL (ref 0.2–1.2)
Total Protein: 7.3 g/dL (ref 6.0–8.3)

## 2021-12-13 LAB — HEMOGLOBIN A1C: Hgb A1c MFr Bld: 5.9 % (ref 4.6–6.5)

## 2021-12-13 MED ORDER — LEVOTHYROXINE SODIUM 25 MCG PO TABS
25.0000 ug | ORAL_TABLET | Freq: Every day | ORAL | 3 refills | Status: DC
Start: 2021-12-13 — End: 2022-06-24

## 2021-12-13 NOTE — Assessment & Plan Note (Signed)
Chronic  Clinically euthyroid Currently taking levothyroxine 25 mcg 6 days a week Check tsh  Titrate med dose if needed

## 2021-12-13 NOTE — Assessment & Plan Note (Signed)
Chronic Controlled, Stable Continue Adderall 20 mg daily

## 2021-12-13 NOTE — Assessment & Plan Note (Signed)
Chronic Controlled, Stable Continue citalopram 10 mg daily, clonazepam 0.5 mg bedtime as needed

## 2021-12-13 NOTE — Assessment & Plan Note (Signed)
Chronic Check a1c Low sugar / carb diet Stressed regular exercise  

## 2022-01-01 ENCOUNTER — Other Ambulatory Visit: Payer: Self-pay | Admitting: Internal Medicine

## 2022-01-01 MED ORDER — AMPHETAMINE-DEXTROAMPHET ER 20 MG PO CP24
20.0000 mg | ORAL_CAPSULE | Freq: Every morning | ORAL | 0 refills | Status: DC
Start: 2022-01-01 — End: 2022-02-03

## 2022-01-30 ENCOUNTER — Other Ambulatory Visit: Payer: Self-pay | Admitting: Internal Medicine

## 2022-02-03 ENCOUNTER — Telehealth: Payer: Self-pay

## 2022-02-03 MED ORDER — AMPHETAMINE-DEXTROAMPHET ER 20 MG PO CP24: 20.0000 mg | ORAL_CAPSULE | Freq: Every morning | ORAL | 0 refills | Status: DC

## 2022-02-03 NOTE — Telephone Encounter (Signed)
Pt is requesting a refill on: amphetamine-dextroamphetamine (ADDERALL XR) 20 MG 24 hr capsule  Pharmacy: [facility] - [place], [place] - [address]  [place] 12/13/21  Pt Cb [phone]

## 2022-02-03 NOTE — Telephone Encounter (Signed)
Dr. Quay Burow has sent in refill already.

## 2022-03-03 MED ORDER — AMPHETAMINE-DEXTROAMPHET ER 20 MG PO CP24: 20.0000 mg | ORAL_CAPSULE | Freq: Every morning | ORAL | 0 refills | Status: DC

## 2022-03-03 NOTE — Telephone Encounter (Signed)
1.Medication Requested: amphetamine-dextroamphetamine (ADDERALL XR) 20 MG 24 hr capsule ? ?2. Pharmacy (Name, Street, [place]):  ?[facility] [address], [place] - [address] Phone:  [phone]  ?Fax:  [phone]  ?  ? ? ?3. On Med List: yes ? ?4. Last Visit with PCP: 01.06.23 ? ?5. Next visit date with PCP: 07.18.23 ? ? ?Agent: Please be advised that RX refills may take up to 3 business days. We ask that you follow-up with your pharmacy.  ?

## 2022-03-03 NOTE — Addendum Note (Signed)
Addended by: Binnie Rail on: 03/03/2022 12:14 PM ? ? Modules accepted: Orders ? ?

## 2022-03-05 ENCOUNTER — Other Ambulatory Visit (HOSPITAL_COMMUNITY): Payer: Self-pay

## 2022-03-05 MED ORDER — AMPHETAMINE-DEXTROAMPHET ER 20 MG PO CP24
20.0000 mg | ORAL_CAPSULE | Freq: Every morning | ORAL | 0 refills | Status: DC
  Filled 2022-03-05: qty 30, 30d supply, fill #0

## 2022-03-05 NOTE — Telephone Encounter (Signed)
sent 

## 2022-03-05 NOTE — Telephone Encounter (Signed)
Pt is requesting that the Rx be sent to ?[facility]  ? ?[facility] was out of stock ?

## 2022-03-05 NOTE — Addendum Note (Signed)
Addended by: Pincus Sanes on: 03/05/2022 12:50 PM ? ? Modules accepted: Orders ? ?

## 2022-03-31 ENCOUNTER — Telehealth: Payer: Self-pay | Admitting: *Deleted

## 2022-03-31 ENCOUNTER — Encounter: Payer: Self-pay | Admitting: Internal Medicine

## 2022-03-31 NOTE — Telephone Encounter (Signed)
Abstracted mammogram../lmb 

## 2022-04-03 ENCOUNTER — Other Ambulatory Visit: Payer: Self-pay | Admitting: Internal Medicine

## 2022-04-03 MED ORDER — AMPHETAMINE-DEXTROAMPHET ER 20 MG PO CP24
20.0000 mg | ORAL_CAPSULE | Freq: Every morning | ORAL | 0 refills | Status: DC
  Filled 2022-04-03: qty 30, 30d supply, fill #0

## 2022-04-04 ENCOUNTER — Other Ambulatory Visit (HOSPITAL_COMMUNITY): Payer: Self-pay

## 2022-05-02 ENCOUNTER — Other Ambulatory Visit (HOSPITAL_COMMUNITY): Payer: Self-pay

## 2022-05-02 ENCOUNTER — Other Ambulatory Visit: Payer: Self-pay | Admitting: Internal Medicine

## 2022-05-02 MED ORDER — AMPHETAMINE-DEXTROAMPHET ER 20 MG PO CP24
20.0000 mg | ORAL_CAPSULE | Freq: Every morning | ORAL | 0 refills | Status: DC
  Filled 2022-05-02 (×2): qty 30, 30d supply, fill #0

## 2022-06-02 ENCOUNTER — Other Ambulatory Visit: Payer: Self-pay | Admitting: Internal Medicine

## 2022-06-02 ENCOUNTER — Other Ambulatory Visit (HOSPITAL_COMMUNITY): Payer: Self-pay

## 2022-06-04 ENCOUNTER — Other Ambulatory Visit (HOSPITAL_COMMUNITY): Payer: Self-pay

## 2022-06-04 ENCOUNTER — Other Ambulatory Visit: Payer: Self-pay | Admitting: Internal Medicine

## 2022-06-04 MED ORDER — AMPHETAMINE-DEXTROAMPHET ER 20 MG PO CP24
20.0000 mg | ORAL_CAPSULE | Freq: Every morning | ORAL | 0 refills | Status: DC
  Filled 2022-06-04: qty 30, 30d supply, fill #0

## 2022-06-22 ENCOUNTER — Encounter: Payer: Self-pay | Admitting: Internal Medicine

## 2022-06-22 NOTE — Patient Instructions (Addendum)
     Blood work was ordered.     Medications changes include :   none     A thyroid ultrasound was ordered.     Someone will call you to schedule an appointment.    Return in about 6 months (around 12/25/2022) for Physical Exam.

## 2022-06-22 NOTE — Progress Notes (Unsigned)
      Subjective:    Patient ID: Melanie Castaneda, female    DOB: Oct 08, 1976, 46 y.o.   MRN: [id number]     HPI Melanie Castaneda is here for follow up of her chronic medical problems, including hypothyroid, prediabetes, ADD, anxiety, depression  Due for thyroid US.   mammo  Tdap due  Medications and allergies reviewed with patient and updated if appropriate.  Current Outpatient Medications on File Prior to Visit  Medication Sig Dispense Refill   amphetamine-dextroamphetamine (ADDERALL XR) 20 MG 24 hr capsule Take 1 capsule (20 mg total) by mouth every morning. 30 capsule 0   Calcium Carb-Cholecalciferol (CALCIUM 600/VITAMIN D3) 600-800 MG-UNIT TABS Take 1 tablet by mouth daily. 60 tablet    EPINEPHrine (EPIPEN 2-PAK) 0.3 mg/0.3 mL IJ SOAJ injection Inject 0.3 mLs (0.3 mg total) into the muscle once. May repeat if needed 1 Device 1   levonorgestrel (MIRENA, 52 MG,) 20 MCG/DAY IUD Mirena 20 mcg/24 hours (8 yrs) 52 mg intrauterine device  Take 1 device by intrauterine route.     levothyroxine (SYNTHROID) 25 MCG tablet Take 1 tablet (25 mcg total) by mouth daily before breakfast. Take 6 days of week only. 72 tablet 3   Multiple Vitamins-Minerals (MULTIVITAMIN) tablet Take 1 tablet by mouth daily.     No current facility-administered medications on file prior to visit.     Review of Systems     Objective:  There were no vitals filed for this visit. BP Readings from Last 3 Encounters:  12/13/21 118/76  05/07/21 120/72  10/26/20 128/78   Wt Readings from Last 3 Encounters:  12/13/21 150 lb 9.6 oz (68.3 kg)  05/07/21 149 lb (67.6 kg)  10/26/20 163 lb (73.9 kg)   There is no height or weight on file to calculate BMI.    Physical Exam     Lab Results  Component Value Date   WBC 8.2 04/26/2019   HGB 14.1 04/26/2019   HCT 42.3 04/26/2019   PLT 375.0 04/26/2019   GLUCOSE 106 (H) 12/13/2021   CHOL 215 (H) 04/26/2019   TRIG 140.0 04/26/2019   HDL 92.80 04/26/2019   LDLDIRECT  99.5 06/08/2013   LDLCALC 94 04/26/2019   ALT 20 12/13/2021   AST 20 12/13/2021   NA 138 12/13/2021   K 4.1 12/13/2021   CL 103 12/13/2021   CREATININE 0.88 12/13/2021   BUN 12 12/13/2021   CO2 27 12/13/2021   TSH 0.53 12/13/2021   HGBA1C 5.9 12/13/2021     Assessment & Plan:    See Problem List for Assessment and Plan of chronic medical problems.

## 2022-06-24 ENCOUNTER — Ambulatory Visit (INDEPENDENT_AMBULATORY_CARE_PROVIDER_SITE_OTHER): Payer: BC Managed Care – PPO | Admitting: Internal Medicine

## 2022-06-24 VITALS — BP 122/72 | HR 79 | Temp 98.1°F | Ht 69.0 in | Wt 159.0 lb

## 2022-06-24 DIAGNOSIS — E042 Nontoxic multinodular goiter: Secondary | ICD-10-CM

## 2022-06-24 DIAGNOSIS — R7303 Prediabetes: Secondary | ICD-10-CM

## 2022-06-24 DIAGNOSIS — F988 Other specified behavioral and emotional disorders with onset usually occurring in childhood and adolescence: Secondary | ICD-10-CM | POA: Diagnosis not present

## 2022-06-24 DIAGNOSIS — E039 Hypothyroidism, unspecified: Secondary | ICD-10-CM | POA: Diagnosis not present

## 2022-06-24 MED ORDER — LEVOTHYROXINE SODIUM 25 MCG PO TABS: 25.0000 ug | ORAL_TABLET | Freq: Every day | ORAL | 3 refills | Status: DC

## 2022-06-24 NOTE — Assessment & Plan Note (Signed)
Chronic  ?Clinically euthyroid ?Currently taking levothyroxine 25 mcg daily ?Check tsh  ?Titrate med dose if needed ?

## 2022-06-24 NOTE — Assessment & Plan Note (Signed)
Chronic Check a1c Low sugar / carb diet Stressed regular exercise  

## 2022-06-24 NOTE — Assessment & Plan Note (Signed)
Chronic Controlled, Stable Continue Adderall XR 20 mg daily

## 2022-06-24 NOTE — Assessment & Plan Note (Signed)
Chronic Last ultrasound 1 year ago-stable nodules-advised follow-up in 1 year TSH ordered Thyroid ultrasound ordered

## 2022-06-25 DIAGNOSIS — Z01419 Encounter for gynecological examination (general) (routine) without abnormal findings: Secondary | ICD-10-CM | POA: Diagnosis not present

## 2022-06-25 DIAGNOSIS — Z1231 Encounter for screening mammogram for malignant neoplasm of breast: Secondary | ICD-10-CM | POA: Diagnosis not present

## 2022-06-25 DIAGNOSIS — Z130 Encounter for screening for diseases of the blood and blood-forming organs and certain disorders involving the immune mechanism: Secondary | ICD-10-CM | POA: Diagnosis not present

## 2022-06-25 DIAGNOSIS — Z1389 Encounter for screening for other disorder: Secondary | ICD-10-CM | POA: Diagnosis not present

## 2022-06-25 DIAGNOSIS — Z6823 Body mass index (BMI) 23.0-23.9, adult: Secondary | ICD-10-CM | POA: Diagnosis not present

## 2022-07-03 ENCOUNTER — Other Ambulatory Visit (HOSPITAL_COMMUNITY): Payer: Self-pay

## 2022-07-03 ENCOUNTER — Other Ambulatory Visit: Payer: Self-pay | Admitting: Internal Medicine

## 2022-07-03 MED ORDER — AMPHETAMINE-DEXTROAMPHET ER 20 MG PO CP24
20.0000 mg | ORAL_CAPSULE | Freq: Every morning | ORAL | 0 refills | Status: DC
Start: 2022-07-03 — End: 2022-07-30
  Filled 2022-07-03: qty 30, 30d supply, fill #0

## 2022-07-03 NOTE — Telephone Encounter (Signed)
Check [place] registry last filled 06/04/2022.Marland KitchenRaechel Castaneda

## 2022-07-07 ENCOUNTER — Ambulatory Visit
Admission: RE | Admit: 2022-07-07 | Discharge: 2022-07-07 | Disposition: A | Discharge status: Home / Self Care | Payer: BC Managed Care – PPO | Source: Ambulatory Visit | Attending: Internal Medicine | Admitting: Internal Medicine

## 2022-07-07 DIAGNOSIS — E042 Nontoxic multinodular goiter: Secondary | ICD-10-CM

## 2022-07-29 ENCOUNTER — Encounter: Payer: Self-pay | Admitting: Internal Medicine

## 2022-07-30 ENCOUNTER — Other Ambulatory Visit: Payer: Self-pay | Admitting: Internal Medicine

## 2022-07-31 ENCOUNTER — Other Ambulatory Visit (HOSPITAL_COMMUNITY): Payer: Self-pay

## 2022-07-31 MED ORDER — AMPHETAMINE-DEXTROAMPHET ER 20 MG PO CP24
20.0000 mg | ORAL_CAPSULE | Freq: Every morning | ORAL | 0 refills | Status: DC
  Filled 2022-08-07: qty 30, 30d supply, fill #0

## 2022-08-07 ENCOUNTER — Other Ambulatory Visit (HOSPITAL_COMMUNITY): Payer: Self-pay

## 2022-09-01 ENCOUNTER — Other Ambulatory Visit: Payer: Self-pay | Admitting: Internal Medicine

## 2022-09-01 ENCOUNTER — Other Ambulatory Visit (HOSPITAL_COMMUNITY): Payer: Self-pay

## 2022-09-01 MED ORDER — AMPHETAMINE-DEXTROAMPHET ER 20 MG PO CP24
20.0000 mg | ORAL_CAPSULE | Freq: Every morning | ORAL | 0 refills | Status: DC
Start: 2022-09-01 — End: 2022-10-12
  Filled 2022-09-01 – 2022-09-05 (×2): qty 30, 30d supply, fill #0

## 2022-09-05 ENCOUNTER — Other Ambulatory Visit (HOSPITAL_COMMUNITY): Payer: Self-pay

## 2022-09-12 ENCOUNTER — Other Ambulatory Visit (HOSPITAL_COMMUNITY): Payer: Self-pay

## 2022-09-22 ENCOUNTER — Encounter: Payer: Self-pay | Admitting: Internal Medicine

## 2022-10-12 ENCOUNTER — Other Ambulatory Visit: Payer: Self-pay | Admitting: Internal Medicine

## 2022-10-13 ENCOUNTER — Other Ambulatory Visit (HOSPITAL_COMMUNITY): Payer: Self-pay

## 2022-10-13 MED ORDER — AMPHETAMINE-DEXTROAMPHET ER 20 MG PO CP24
20.0000 mg | ORAL_CAPSULE | Freq: Every morning | ORAL | 0 refills | Status: DC
  Filled 2022-10-13: qty 30, 30d supply, fill #0

## 2022-11-10 ENCOUNTER — Other Ambulatory Visit: Payer: Self-pay | Admitting: Internal Medicine

## 2022-11-10 MED ORDER — AMPHETAMINE-DEXTROAMPHET ER 20 MG PO CP24
20.0000 mg | ORAL_CAPSULE | Freq: Every morning | ORAL | 0 refills | Status: DC
  Filled 2022-11-10: qty 30, 30d supply, fill #0

## 2022-11-11 ENCOUNTER — Other Ambulatory Visit (HOSPITAL_COMMUNITY): Payer: Self-pay

## 2022-11-17 ENCOUNTER — Other Ambulatory Visit (HOSPITAL_COMMUNITY): Payer: Self-pay

## 2022-12-11 DIAGNOSIS — H5213 Myopia, bilateral: Secondary | ICD-10-CM | POA: Diagnosis not present

## 2022-12-13 ENCOUNTER — Other Ambulatory Visit: Payer: Self-pay | Admitting: Internal Medicine

## 2022-12-15 ENCOUNTER — Other Ambulatory Visit (HOSPITAL_COMMUNITY): Payer: Self-pay

## 2022-12-15 DIAGNOSIS — L570 Actinic keratosis: Secondary | ICD-10-CM | POA: Diagnosis not present

## 2022-12-15 MED ORDER — AMPHETAMINE-DEXTROAMPHET ER 20 MG PO CP24
20.0000 mg | ORAL_CAPSULE | Freq: Every morning | ORAL | 0 refills | Status: DC
  Filled 2022-12-15 – 2022-12-17 (×2): qty 30, 30d supply, fill #0

## 2022-12-17 ENCOUNTER — Other Ambulatory Visit (HOSPITAL_COMMUNITY): Payer: Self-pay

## 2023-01-19 ENCOUNTER — Other Ambulatory Visit: Payer: Self-pay | Admitting: Internal Medicine

## 2023-01-21 ENCOUNTER — Other Ambulatory Visit: Payer: Self-pay

## 2023-01-21 ENCOUNTER — Other Ambulatory Visit (HOSPITAL_COMMUNITY): Payer: Self-pay

## 2023-01-21 MED ORDER — AMPHETAMINE-DEXTROAMPHET ER 20 MG PO CP24
20.0000 mg | ORAL_CAPSULE | Freq: Every morning | ORAL | 0 refills | Status: DC
  Filled 2023-01-21: qty 30, 30d supply, fill #0

## 2023-02-01 ENCOUNTER — Encounter: Payer: Self-pay | Admitting: Internal Medicine

## 2023-02-01 NOTE — Progress Notes (Signed)
      Subjective:    Patient ID: Melanie Castaneda, female    DOB: [birth year], 47 y.o.   MRN: [id number]     HPI Melanie Castaneda is here for follow up of her chronic medical problems, including hypothyroid, prediabetes, add    Medications and allergies reviewed with patient and updated if appropriate.  Current Outpatient Medications on File Prior to Visit  Medication Sig Dispense Refill  . Calcium Carb-Cholecalciferol (CALCIUM 600/VITAMIN D3) 600-800 MG-UNIT TABS Take 1 tablet by mouth daily. 60 tablet   . EPINEPHrine (EPIPEN 2-PAK) 0.3 mg/0.3 mL IJ SOAJ injection Inject 0.3 mLs (0.3 mg total) into the muscle once. May repeat if needed 1 Device 1  . levonorgestrel (MIRENA, 52 MG,) 20 MCG/DAY IUD Mirena 20 mcg/24 hours (8 yrs) 52 mg intrauterine device  Take 1 device by intrauterine route.    Marland Kitchen levothyroxine (SYNTHROID) 25 MCG tablet Take 1 tablet (25 mcg total) by mouth daily before breakfast. Take 6 days of week only. 72 tablet 3  . Multiple Vitamins-Minerals (MULTIVITAMIN) tablet Take 1 tablet by mouth daily.     No current facility-administered medications on file prior to visit.     Review of Systems     Objective:  There were no vitals filed for this visit. BP Readings from Last 3 Encounters:  03/18/23 110/78  06/24/22 122/72  12/13/21 118/76   Wt Readings from Last 3 Encounters:  03/18/23 159 lb (72.1 kg)  06/24/22 159 lb (72.1 kg)  12/13/21 150 lb 9.6 oz (68.3 kg)   There is no height or weight on file to calculate BMI.    Physical Exam     Lab Results  Component Value Date   WBC 8.2 03/18/2023   HGB 13.8 03/18/2023   HCT 42.1 03/18/2023   PLT 251.0 03/18/2023   GLUCOSE 96 03/18/2023   CHOL 242 (H) 03/18/2023   TRIG 85.0 03/18/2023   HDL 113.30 03/18/2023   LDLDIRECT 99.5 06/08/2013   LDLCALC 111 (H) 03/18/2023   ALT 16 03/18/2023   AST 24 03/18/2023   NA 137 03/18/2023   K 3.7 03/18/2023   CL 102 03/18/2023   CREATININE 0.94 03/18/2023   BUN 10  03/18/2023   CO2 26 03/18/2023   TSH 0.52 03/18/2023   HGBA1C 5.6 03/18/2023     Assessment & Plan:    See Problem List for Assessment and Plan of chronic medical problems.    This encounter was created in error - please disregard.

## 2023-02-01 NOTE — Patient Instructions (Addendum)
      Blood work was ordered.   The lab is on the first floor.    Medications changes include :       A thyroid ultrasound was ordered for July 2024.    Someone will call you to schedule an appointment.    Return in about 6 months (around 08/04/2023) for Physical Exam.

## 2023-02-03 ENCOUNTER — Encounter: Payer: BC Managed Care – PPO | Admitting: Internal Medicine

## 2023-02-03 DIAGNOSIS — R7303 Prediabetes: Secondary | ICD-10-CM

## 2023-02-03 DIAGNOSIS — E039 Hypothyroidism, unspecified: Secondary | ICD-10-CM

## 2023-02-03 DIAGNOSIS — E042 Nontoxic multinodular goiter: Secondary | ICD-10-CM

## 2023-02-19 ENCOUNTER — Other Ambulatory Visit: Payer: Self-pay | Admitting: Internal Medicine

## 2023-02-19 MED ORDER — AMPHETAMINE-DEXTROAMPHET ER 20 MG PO CP24
20.0000 mg | ORAL_CAPSULE | Freq: Every morning | ORAL | 0 refills | Status: DC
  Filled 2023-02-19: qty 30, 30d supply, fill #0

## 2023-02-20 ENCOUNTER — Other Ambulatory Visit: Payer: Self-pay

## 2023-02-20 ENCOUNTER — Other Ambulatory Visit (HOSPITAL_COMMUNITY): Payer: Self-pay

## 2023-03-17 NOTE — Patient Instructions (Addendum)
Blood work was ordered.   The lab is on the first floor.    Medications changes include :   Adderall 5 mg daily as needed    A thyroid ultrasound was ordered.     Someone will call you to schedule an appointment.    Return in about 6 months (around 09/17/2023) for follow up.   Health Maintenance, Female Adopting a healthy lifestyle and getting preventive care are important in promoting health and wellness. Ask your health care provider about: The right schedule for you to have regular tests and exams. Things you can do on your own to prevent diseases and keep yourself healthy. What should I know about diet, weight, and exercise? Eat a healthy diet  Eat a diet that includes plenty of vegetables, fruits, low-fat dairy products, and lean protein. Do not eat a lot of foods that are high in solid fats, added sugars, or sodium. Maintain a healthy weight Body mass index (BMI) is used to identify weight problems. It estimates body fat based on height and weight. Your health care provider can help determine your BMI and help you achieve or maintain a healthy weight. Get regular exercise Get regular exercise. This is one of the most important things you can do for your health. Most adults should: Exercise for at least 150 minutes each week. The exercise should increase your heart rate and make you sweat (moderate-intensity exercise). Do strengthening exercises at least twice a week. This is in addition to the moderate-intensity exercise. Spend less time sitting. Even light physical activity can be beneficial. Watch cholesterol and blood lipids Have your blood tested for lipids and cholesterol at 45 years of age, then have this test every 5 years. Have your cholesterol levels checked more often if: Your lipid or cholesterol levels are high. You are older than 45 years of age. You are at high risk for heart disease. What should I know about cancer screening? Depending on your  health history and family history, you may need to have cancer screening at various ages. This may include screening for: Breast cancer. Cervical cancer. Colorectal cancer. Skin cancer. Lung cancer. What should I know about heart disease, diabetes, and high blood pressure? Blood pressure and heart disease High blood pressure causes heart disease and increases the risk of stroke. This is more likely to develop in people who have high blood pressure readings or are overweight. Have your blood pressure checked: Every 3-5 years if you are 91-26 years of age. Every year if you are 72 years old or older. Diabetes Have regular diabetes screenings. This checks your fasting blood sugar level. Have the screening done: Once every three years after age 62 if you are at a normal weight and have a low risk for diabetes. More often and at a younger age if you are overweight or have a high risk for diabetes. What should I know about preventing infection? Hepatitis B If you have a higher risk for hepatitis B, you should be screened for this virus. Talk with your health care provider to find out if you are at risk for hepatitis B infection. Hepatitis C Testing is recommended for: Everyone born from [birth year] through 1963. Anyone with known risk factors for hepatitis C. Sexually transmitted infections (STIs) Get screened for STIs, including gonorrhea and chlamydia, if: You are sexually active and are younger than 45 years of age. You are older than 45 years of age and your health care provider tells you  that you are at risk for this type of infection. Your sexual activity has changed since you were last screened, and you are at increased risk for chlamydia or gonorrhea. Ask your health care provider if you are at risk. Ask your health care provider about whether you are at high risk for HIV. Your health care provider may recommend a prescription medicine to help prevent HIV infection. If you choose to take  medicine to prevent HIV, you should first get tested for HIV. You should then be tested every 3 months for as long as you are taking the medicine. Pregnancy If you are about to stop having your period (premenopausal) and you may become pregnant, seek counseling before you get pregnant. Take 400 to 800 micrograms (mcg) of folic acid every day if you become pregnant. Ask for birth control (contraception) if you want to prevent pregnancy. Osteoporosis and menopause Osteoporosis is a disease in which the bones lose minerals and strength with aging. This can result in bone fractures. If you are 3 years old or older, or if you are at risk for osteoporosis and fractures, ask your health care provider if you should: Be screened for bone loss. Take a calcium or vitamin D supplement to lower your risk of fractures. Be given hormone replacement therapy (HRT) to treat symptoms of menopause. Follow these instructions at home: Alcohol use Do not drink alcohol if: Your health care provider tells you not to drink. You are pregnant, may be pregnant, or are planning to become pregnant. If you drink alcohol: Limit how much you have to: 0-1 drink a day. Know how much alcohol is in your drink. In the [place] one drink equals one 12 oz bottle of beer (355 mL), one 5 oz glass of wine (148 mL), or one 1 oz glass of hard liquor (44 mL). Lifestyle Do not use any products that contain nicotine or tobacco. These products include cigarettes, chewing tobacco, and vaping devices, such as e-cigarettes. If you need help quitting, ask your health care provider. Do not use street drugs. Do not share needles. Ask your health care provider for help if you need support or information about quitting drugs. General instructions Schedule regular health, dental, and eye exams. Stay current with your vaccines. Tell your health care provider if: You often feel depressed. You have ever been abused or do not feel safe at  home. Summary Adopting a healthy lifestyle and getting preventive care are important in promoting health and wellness. Follow your health care provider's instructions about healthy diet, exercising, and getting tested or screened for diseases. Follow your health care provider's instructions on monitoring your cholesterol and blood pressure. This information is not intended to replace advice given to you by your health care provider. Make sure you discuss any questions you have with your health care provider. Document Revised: 04/15/2021 Document Reviewed: 04/15/2021 Elsevier Patient Education  [place].

## 2023-03-17 NOTE — Progress Notes (Unsigned)
Subjective:    Patient ID: Melanie Castaneda, female    DOB: 1976-10-07, 47 y.o.   MRN: [id number]      HPI Melanie Castaneda is here for a Physical exam and her chronic medical problems.   Adderall not working quite as good all the time.     Medications and allergies reviewed with patient and updated if appropriate.  Current Outpatient Medications on File Prior to Visit  Medication Sig Dispense Refill   Calcium Carb-Cholecalciferol (CALCIUM 600/VITAMIN D3) 600-800 MG-UNIT TABS Take 1 tablet by mouth daily. 60 tablet    EPINEPHrine (EPIPEN 2-PAK) 0.3 mg/0.3 mL IJ SOAJ injection Inject 0.3 mLs (0.3 mg total) into the muscle once. May repeat if needed 1 Device 1   levonorgestrel (MIRENA, 52 MG,) 20 MCG/DAY IUD Mirena 20 mcg/24 hours (8 yrs) 52 mg intrauterine device  Take 1 device by intrauterine route.     levothyroxine (SYNTHROID) 25 MCG tablet Take 1 tablet (25 mcg total) by mouth daily before breakfast. Take 6 days of week only. 72 tablet 3   Multiple Vitamins-Minerals (MULTIVITAMIN) tablet Take 1 tablet by mouth daily.     No current facility-administered medications on file prior to visit.    Review of Systems  Constitutional:  Negative for fever.  Eyes:  Negative for visual disturbance.  Respiratory:  Negative for cough, shortness of breath and wheezing.   Cardiovascular:  Negative for chest pain, palpitations and leg swelling.  Gastrointestinal:  Negative for abdominal pain, blood in stool, constipation and diarrhea.       No gerd  Genitourinary:  Negative for dysuria.  Musculoskeletal:  Negative for arthralgias and back pain.  Skin:  Negative for rash.  Neurological:  Negative for light-headedness and headaches.  Psychiatric/Behavioral:  Negative for dysphoric mood. The patient is not nervous/anxious.        Objective:   Vitals:   03/18/23 1300  BP: 110/78  Pulse: 78  Temp: 98.6 F (37 C)  SpO2: 98%   Filed Weights   03/18/23 1300  Weight: 159 lb (72.1 kg)    Body mass index is 23.48 kg/m.  BP Readings from Last 3 Encounters:  03/18/23 110/78  06/24/22 122/72  12/13/21 118/76    Wt Readings from Last 3 Encounters:  03/18/23 159 lb (72.1 kg)  06/24/22 159 lb (72.1 kg)  12/13/21 150 lb 9.6 oz (68.3 kg)       Physical Exam Constitutional: She appears well-developed and well-nourished. No distress.  HENT:  Head: Normocephalic and atraumatic.  Right Ear: External ear normal. Normal ear canal and TM Left Ear: External ear normal.  Normal ear canal and TM Mouth/Throat: Oropharynx is clear and moist.  Eyes: Conjunctivae normal.  Neck: Neck supple. No tracheal deviation present. No thyromegaly present.  No carotid bruit  Cardiovascular: Normal rate, regular rhythm and normal heart sounds.   No murmur heard.  No edema. Pulmonary/Chest: Effort normal and breath sounds normal. No respiratory distress. She has no wheezes. She has no rales.  Breast: deferred   Abdominal: Soft. She exhibits no distension. There is no tenderness.  Lymphadenopathy: She has no cervical adenopathy.  Skin: Skin is warm and dry. She is not diaphoretic.  Psychiatric: She has a normal mood and affect. Her behavior is normal.     Lab Results  Component Value Date   WBC 8.2 04/26/2019   HGB 14.1 04/26/2019   HCT 42.3 04/26/2019   PLT 375.0 04/26/2019   GLUCOSE 106 (H) 12/13/2021   CHOL  215 (H) 04/26/2019   TRIG 140.0 04/26/2019   HDL 92.80 04/26/2019   LDLDIRECT 99.5 06/08/2013   LDLCALC 94 04/26/2019   ALT 20 12/13/2021   AST 20 12/13/2021   NA 138 12/13/2021   K 4.1 12/13/2021   CL 103 12/13/2021   CREATININE 0.88 12/13/2021   BUN 12 12/13/2021   CO2 27 12/13/2021   TSH 0.53 12/13/2021   HGBA1C 5.9 12/13/2021         Assessment & Plan:   Physical exam: Screening blood work  ordered Exercise  regular Weight  normal Substance abuse  none   Reviewed recommended immunizations.   Health Maintenance  Topic Date Due   DTaP/Tdap/Td (2 -  Td or Tdap) 03/08/2020   MAMMOGRAM  11/05/2021   COVID-19 Vaccine (3 - 2023-24 season) 04/03/2023 (Originally 08/08/2022)   INFLUENZA VACCINE  07/09/2023   PAP SMEAR-Modifier  05/11/2024   Hepatitis C Screening  Completed   HIV Screening  Completed   HPV VACCINES  Aged Out          See Problem List for Assessment and Plan of chronic medical problems.

## 2023-03-18 ENCOUNTER — Ambulatory Visit (INDEPENDENT_AMBULATORY_CARE_PROVIDER_SITE_OTHER): Payer: BC Managed Care – PPO | Admitting: Internal Medicine

## 2023-03-18 ENCOUNTER — Other Ambulatory Visit (HOSPITAL_COMMUNITY): Payer: Self-pay

## 2023-03-18 ENCOUNTER — Encounter: Payer: Self-pay | Admitting: Internal Medicine

## 2023-03-18 VITALS — BP 110/78 | HR 78 | Temp 98.6°F | Ht 69.0 in | Wt 159.0 lb

## 2023-03-18 DIAGNOSIS — F988 Other specified behavioral and emotional disorders with onset usually occurring in childhood and adolescence: Secondary | ICD-10-CM | POA: Diagnosis not present

## 2023-03-18 DIAGNOSIS — R7303 Prediabetes: Secondary | ICD-10-CM

## 2023-03-18 DIAGNOSIS — E039 Hypothyroidism, unspecified: Secondary | ICD-10-CM | POA: Diagnosis not present

## 2023-03-18 DIAGNOSIS — E042 Nontoxic multinodular goiter: Secondary | ICD-10-CM

## 2023-03-18 DIAGNOSIS — Z0000 Encounter for general adult medical examination without abnormal findings: Secondary | ICD-10-CM

## 2023-03-18 LAB — CBC WITH DIFFERENTIAL/PLATELET
Basophils Absolute: 0 10*3/uL (ref 0.0–0.1)
Basophils Relative: 0.6 % (ref 0.0–3.0)
Eosinophils Absolute: 0.1 10*3/uL (ref 0.0–0.7)
Eosinophils Relative: 1.8 % (ref 0.0–5.0)
HCT: 42.1 % (ref 36.0–46.0)
Hemoglobin: 13.8 g/dL (ref 12.0–15.0)
Lymphocytes Relative: 31.9 % (ref 12.0–46.0)
Lymphs Abs: 2.6 10*3/uL (ref 0.7–4.0)
MCHC: 32.8 g/dL (ref 30.0–36.0)
MCV: 87.9 fl (ref 78.0–100.0)
Monocytes Absolute: 0.7 10*3/uL (ref 0.1–1.0)
Monocytes Relative: 8.6 % (ref 3.0–12.0)
Neutro Abs: 4.7 10*3/uL (ref 1.4–7.7)
Neutrophils Relative %: 57.1 % (ref 43.0–77.0)
Platelets: 251 10*3/uL (ref 150.0–400.0)
RBC: 4.79 Mil/uL (ref 3.87–5.11)
RDW: 14.7 % (ref 11.5–15.5)
WBC: 8.2 10*3/uL (ref 4.0–10.5)

## 2023-03-18 LAB — LIPID PANEL
Cholesterol: 242 mg/dL — ABNORMAL HIGH (ref 0–200)
HDL: 113.3 mg/dL (ref >39.00)
LDL Cholesterol: 111 mg/dL — ABNORMAL HIGH (ref 0–99)
NonHDL: 128.2
Total CHOL/HDL Ratio: 2
Triglycerides: 85 mg/dL (ref 0.0–149.0)
VLDL: 17 mg/dL (ref 0.0–40.0)

## 2023-03-18 LAB — TSH: TSH: 0.52 u[IU]/mL (ref 0.35–5.50)

## 2023-03-18 LAB — COMPREHENSIVE METABOLIC PANEL
ALT: 16 U/L (ref 0–35)
AST: 24 U/L (ref 0–37)
Albumin: 4.5 g/dL (ref 3.5–5.2)
Alkaline Phosphatase: 55 U/L (ref 39–117)
BUN: 10 mg/dL (ref 6–23)
CO2: 26 mEq/L (ref 19–32)
Calcium: 9.4 mg/dL (ref 8.4–10.5)
Chloride: 102 mEq/L (ref 96–112)
Creatinine, Ser: 0.94 mg/dL (ref 0.40–1.20)
GFR: 73.83 mL/min (ref >60.00)
Glucose, Bld: 96 mg/dL (ref 70–99)
Potassium: 3.7 mEq/L (ref 3.5–5.1)
Sodium: 137 mEq/L (ref 135–145)
Total Bilirubin: 0.3 mg/dL (ref 0.2–1.2)
Total Protein: 7.4 g/dL (ref 6.0–8.3)

## 2023-03-18 LAB — HEMOGLOBIN A1C: Hgb A1c MFr Bld: 5.6 % (ref 4.6–6.5)

## 2023-03-18 MED ORDER — AMPHETAMINE-DEXTROAMPHET ER 20 MG PO CP24
20.0000 mg | ORAL_CAPSULE | Freq: Every morning | ORAL | 0 refills | Status: DC
  Filled 2023-03-18 – 2023-03-27 (×2): qty 30, 30d supply, fill #0

## 2023-03-18 MED ORDER — AMPHETAMINE-DEXTROAMPHETAMINE 5 MG PO TABS
5.0000 mg | ORAL_TABLET | Freq: Every day | ORAL | 0 refills | Status: DC | PRN
  Filled 2023-03-18: qty 30, 30d supply, fill #0

## 2023-03-18 NOTE — Assessment & Plan Note (Signed)
Chronic Denies any obvious changes in thyroid size Ultrasound recommended by radiology for follow-up to establish stability-ordered Check TSH

## 2023-03-18 NOTE — Assessment & Plan Note (Signed)
Chronic Check a1c, CBC, CMP, lipid panel Low sugar / carb diet Stressed regular exercise

## 2023-03-18 NOTE — Assessment & Plan Note (Signed)
Chronic  Clinically euthyroid Check tsh and will titrate med dose if needed Currently taking levothyroxine 25 mcg daily -6 days a week only

## 2023-03-18 NOTE — Assessment & Plan Note (Addendum)
Chronic Controlled for the most part - Adderall is not working as well Continue Adderall XR 20 mg daily Start Adderall 5 mg daily prn

## 2023-03-20 ENCOUNTER — Other Ambulatory Visit (HOSPITAL_COMMUNITY): Payer: Self-pay

## 2023-03-27 ENCOUNTER — Other Ambulatory Visit (HOSPITAL_COMMUNITY): Payer: Self-pay

## 2023-03-31 ENCOUNTER — Other Ambulatory Visit (HOSPITAL_COMMUNITY): Payer: Self-pay

## 2023-04-26 ENCOUNTER — Other Ambulatory Visit: Payer: Self-pay | Admitting: Internal Medicine

## 2023-04-27 ENCOUNTER — Other Ambulatory Visit (HOSPITAL_COMMUNITY): Payer: Self-pay

## 2023-04-27 MED ORDER — AMPHETAMINE-DEXTROAMPHET ER 20 MG PO CP24
20.0000 mg | ORAL_CAPSULE | Freq: Every morning | ORAL | 0 refills | Status: DC
  Filled 2023-04-27: qty 30, 30d supply, fill #0

## 2023-04-27 MED ORDER — AMPHETAMINE-DEXTROAMPHETAMINE 5 MG PO TABS
5.0000 mg | ORAL_TABLET | Freq: Every day | ORAL | 0 refills | Status: DC | PRN
  Filled 2023-04-27: qty 30, 30d supply, fill #0

## 2023-05-28 ENCOUNTER — Other Ambulatory Visit: Payer: Self-pay | Admitting: Internal Medicine

## 2023-05-28 ENCOUNTER — Other Ambulatory Visit (HOSPITAL_COMMUNITY): Payer: Self-pay

## 2023-05-28 MED ORDER — AMPHETAMINE-DEXTROAMPHETAMINE 5 MG PO TABS
5.0000 mg | ORAL_TABLET | Freq: Every day | ORAL | 0 refills | Status: DC | PRN
  Filled 2023-05-28: qty 30, 30d supply, fill #0

## 2023-05-28 MED ORDER — AMPHETAMINE-DEXTROAMPHET ER 20 MG PO CP24
20.0000 mg | ORAL_CAPSULE | Freq: Every morning | ORAL | 0 refills | Status: DC
  Filled 2023-05-28: qty 30, 30d supply, fill #0

## 2023-06-15 ENCOUNTER — Other Ambulatory Visit: Payer: Self-pay

## 2023-06-15 ENCOUNTER — Encounter: Payer: Self-pay | Admitting: Internal Medicine

## 2023-06-15 MED ORDER — LEVOTHYROXINE SODIUM 25 MCG PO TABS: 25.0000 ug | ORAL_TABLET | Freq: Every day | ORAL | 3 refills | Status: DC

## 2023-06-27 ENCOUNTER — Other Ambulatory Visit: Payer: Self-pay | Admitting: Internal Medicine

## 2023-06-29 ENCOUNTER — Other Ambulatory Visit (HOSPITAL_COMMUNITY): Payer: Self-pay

## 2023-06-29 DIAGNOSIS — Z1231 Encounter for screening mammogram for malignant neoplasm of breast: Secondary | ICD-10-CM | POA: Diagnosis not present

## 2023-06-29 DIAGNOSIS — Z01419 Encounter for gynecological examination (general) (routine) without abnormal findings: Secondary | ICD-10-CM | POA: Diagnosis not present

## 2023-06-29 MED ORDER — AMPHETAMINE-DEXTROAMPHET ER 20 MG PO CP24
20.0000 mg | ORAL_CAPSULE | Freq: Every morning | ORAL | 0 refills | Status: DC
  Filled 2023-06-29: qty 30, 30d supply, fill #0

## 2023-06-29 MED ORDER — AMPHETAMINE-DEXTROAMPHETAMINE 5 MG PO TABS
5.0000 mg | ORAL_TABLET | Freq: Every day | ORAL | 0 refills | Status: DC | PRN
  Filled 2023-06-29: qty 30, 30d supply, fill #0

## 2023-07-29 ENCOUNTER — Other Ambulatory Visit: Payer: Self-pay

## 2023-08-04 ENCOUNTER — Telehealth: Payer: Self-pay | Admitting: Internal Medicine

## 2023-08-04 ENCOUNTER — Other Ambulatory Visit (HOSPITAL_COMMUNITY): Payer: Self-pay

## 2023-08-04 MED ORDER — AMPHETAMINE-DEXTROAMPHET ER 20 MG PO CP24
20.0000 mg | ORAL_CAPSULE | Freq: Every morning | ORAL | 0 refills | Status: AC
Start: 2023-08-04 — End: ?
  Filled 2023-08-04: qty 30, 30d supply, fill #0

## 2023-08-04 MED ORDER — AMPHETAMINE-DEXTROAMPHETAMINE 5 MG PO TABS
5.0000 mg | ORAL_TABLET | Freq: Every day | ORAL | 0 refills | Status: AC | PRN
Start: 2023-08-04 — End: ?
  Filled 2023-08-04: qty 30, 30d supply, fill #0

## 2023-08-04 NOTE — Telephone Encounter (Signed)
Prescription Request  08/04/2023  LOV: 03/18/2023  What is the name of the medication or equipment? Adderall 20 and 5 mg.  Have you contacted your pharmacy to request a refill? Yes   Which pharmacy would you like this sent to?   [facility]    Patient notified that their request is being sent to the clinical staff for review and that they should receive a response within 2 business days.   Please advise at Mobile [phone] (mobile)

## 2023-09-04 ENCOUNTER — Other Ambulatory Visit (HOSPITAL_COMMUNITY): Payer: Self-pay

## 2023-09-04 ENCOUNTER — Other Ambulatory Visit: Payer: Self-pay

## 2023-09-04 ENCOUNTER — Other Ambulatory Visit: Payer: Self-pay | Admitting: Internal Medicine

## 2023-09-04 MED ORDER — AMPHETAMINE-DEXTROAMPHETAMINE 5 MG PO TABS
5.0000 mg | ORAL_TABLET | Freq: Every day | ORAL | 0 refills | Status: DC | PRN
Start: 2023-09-04 — End: 2023-10-05
  Filled 2023-09-04: qty 30, 30d supply, fill #0

## 2023-09-04 MED ORDER — AMPHETAMINE-DEXTROAMPHET ER 20 MG PO CP24
20.0000 mg | ORAL_CAPSULE | Freq: Every morning | ORAL | 0 refills | Status: DC
Start: 2023-09-04 — End: 2023-10-05
  Filled 2023-09-04: qty 30, 30d supply, fill #0

## 2023-09-07 ENCOUNTER — Other Ambulatory Visit (HOSPITAL_COMMUNITY): Payer: Self-pay

## 2023-09-17 ENCOUNTER — Encounter: Payer: Self-pay | Admitting: Internal Medicine

## 2023-09-17 NOTE — Progress Notes (Signed)
Subjective:    Patient ID: Melanie Castaneda, female    DOB: 1976-11-30, 47 y.o.   MRN: [id number]     HPI Melanie Castaneda is here for follow up of her chronic medical problems.  Doing well - no concerns.  Medications and allergies reviewed with patient and updated if appropriate.  Current Outpatient Medications on File Prior to Visit  Medication Sig Dispense Refill   amphetamine-dextroamphetamine (ADDERALL XR) 20 MG 24 hr capsule Take 1 capsule (20 mg total) by mouth every morning. 30 capsule 0   amphetamine-dextroamphetamine (ADDERALL) 5 MG tablet Take 1 tablet (5 mg total) by mouth daily as needed. 30 tablet 0   Calcium Carb-Cholecalciferol (CALCIUM 600/VITAMIN D3) 600-800 MG-UNIT TABS Take 1 tablet by mouth daily. 60 tablet    EPINEPHrine (EPIPEN 2-PAK) 0.3 mg/0.3 mL IJ SOAJ injection Inject 0.3 mLs (0.3 mg total) into the muscle once. May repeat if needed 1 Device 1   levonorgestrel (MIRENA, 52 MG,) 20 MCG/DAY IUD Mirena 20 mcg/24 hours (8 yrs) 52 mg intrauterine device  Take 1 device by intrauterine route.     levothyroxine (SYNTHROID) 25 MCG tablet Take 1 tablet (25 mcg total) by mouth daily before breakfast. Take 6 days of week only. 72 tablet 3   Multiple Vitamins-Minerals (MULTIVITAMIN) tablet Take 1 tablet by mouth daily.     No current facility-administered medications on file prior to visit.     Review of Systems  Constitutional:  Negative for fever.  Respiratory:  Negative for cough, shortness of breath and wheezing.   Cardiovascular:  Negative for chest pain, palpitations and leg swelling.  Neurological:  Negative for light-headedness and headaches.       Objective:   Vitals:   09/18/23 0752  BP: 114/72  Pulse: 91  Temp: 98 F (36.7 C)  SpO2: 100%   BP Readings from Last 3 Encounters:  09/18/23 114/72  03/18/23 110/78  06/24/22 122/72   Wt Readings from Last 3 Encounters:  09/18/23 152 lb (68.9 kg)  03/18/23 159 lb (72.1 kg)  06/24/22 159 lb  (72.1 kg)   Body mass index is 22.45 kg/m.    Physical Exam Constitutional:      General: She is not in acute distress.    Appearance: Normal appearance.  HENT:     Head: Normocephalic and atraumatic.  Eyes:     Conjunctiva/sclera: Conjunctivae normal.  Cardiovascular:     Rate and Rhythm: Normal rate and regular rhythm.     Heart sounds: Normal heart sounds.  Pulmonary:     Effort: Pulmonary effort is normal. No respiratory distress.     Breath sounds: Normal breath sounds. No wheezing.  Musculoskeletal:     Cervical back: Neck supple.     Right lower leg: No edema.     Left lower leg: No edema.  Lymphadenopathy:     Cervical: No cervical adenopathy.  Skin:    General: Skin is warm and dry.     Findings: No rash.  Neurological:     Mental Status: She is alert. Mental status is at baseline.  Psychiatric:        Mood and Affect: Mood normal.        Behavior: Behavior normal.        Lab Results  Component Value Date   WBC 8.2 03/18/2023   HGB 13.8 03/18/2023   HCT 42.1 03/18/2023   PLT 251.0 03/18/2023   GLUCOSE 96 03/18/2023   CHOL 242 (H) 03/18/2023  TRIG 85.0 03/18/2023   HDL 113.30 03/18/2023   LDLDIRECT 99.5 06/08/2013   LDLCALC 111 (H) 03/18/2023   ALT 16 03/18/2023   AST 24 03/18/2023   NA 137 03/18/2023   K 3.7 03/18/2023   CL 102 03/18/2023   CREATININE 0.94 03/18/2023   BUN 10 03/18/2023   CO2 26 03/18/2023   TSH 0.52 03/18/2023   HGBA1C 5.6 03/18/2023     Assessment & Plan:    See Problem List for Assessment and Plan of chronic medical problems.

## 2023-09-17 NOTE — Patient Instructions (Addendum)
      Call [facility] to schedule your thyroid ultrasound ( ordered 6 months ago) - [phone]     Medications changes include :   none     Return in about 6 months (around 03/18/2024) for Physical Exam.

## 2023-09-18 ENCOUNTER — Ambulatory Visit: Payer: BC Managed Care – PPO | Admitting: Internal Medicine

## 2023-09-18 VITALS — BP 114/72 | HR 91 | Temp 98.0°F | Ht 69.0 in | Wt 152.0 lb

## 2023-09-18 DIAGNOSIS — R7303 Prediabetes: Secondary | ICD-10-CM | POA: Diagnosis not present

## 2023-09-18 DIAGNOSIS — E039 Hypothyroidism, unspecified: Secondary | ICD-10-CM

## 2023-09-18 DIAGNOSIS — R41840 Attention and concentration deficit: Secondary | ICD-10-CM

## 2023-09-18 NOTE — Assessment & Plan Note (Signed)
Chronic Controlled  Continue Adderall XR 20 mg daily, Adderall 5 mg daily prn

## 2023-09-18 NOTE — Assessment & Plan Note (Signed)
Chronic Low sugar / carb diet Stressed regular exercise   

## 2023-09-18 NOTE — Assessment & Plan Note (Signed)
Chronic  Clinically euthyroid Currently taking levothyroxine 25 mcg daily

## 2023-09-22 ENCOUNTER — Encounter: Payer: Self-pay | Admitting: Internal Medicine

## 2023-10-05 ENCOUNTER — Other Ambulatory Visit: Payer: Self-pay | Admitting: Internal Medicine

## 2023-10-05 ENCOUNTER — Other Ambulatory Visit (HOSPITAL_COMMUNITY): Payer: Self-pay

## 2023-10-05 MED ORDER — AMPHETAMINE-DEXTROAMPHET ER 20 MG PO CP24
20.0000 mg | ORAL_CAPSULE | Freq: Every morning | ORAL | 0 refills | Status: DC
Start: 2023-10-05 — End: 2023-11-04
  Filled 2023-10-05: qty 30, 30d supply, fill #0

## 2023-10-05 MED ORDER — AMPHETAMINE-DEXTROAMPHETAMINE 5 MG PO TABS
5.0000 mg | ORAL_TABLET | Freq: Every day | ORAL | 0 refills | Status: DC | PRN
Start: 2023-10-05 — End: 2023-11-04
  Filled 2023-10-05: qty 30, 30d supply, fill #0

## 2023-11-04 ENCOUNTER — Other Ambulatory Visit: Payer: Self-pay | Admitting: Internal Medicine

## 2023-11-04 ENCOUNTER — Other Ambulatory Visit (HOSPITAL_COMMUNITY): Payer: Self-pay

## 2023-11-04 MED ORDER — AMPHETAMINE-DEXTROAMPHET ER 20 MG PO CP24
20.0000 mg | ORAL_CAPSULE | Freq: Every morning | ORAL | 0 refills | Status: DC
Start: 2023-11-04 — End: 2023-12-09
  Filled 2023-11-04: qty 30, 30d supply, fill #0

## 2023-11-04 MED ORDER — AMPHETAMINE-DEXTROAMPHETAMINE 5 MG PO TABS
5.0000 mg | ORAL_TABLET | Freq: Every day | ORAL | 0 refills | Status: DC | PRN
Start: 2023-11-04 — End: 2023-12-09
  Filled 2023-11-04: qty 30, 30d supply, fill #0

## 2023-11-27 DIAGNOSIS — C44622 Squamous cell carcinoma of skin of right upper limb, including shoulder: Secondary | ICD-10-CM | POA: Diagnosis not present

## 2023-12-09 ENCOUNTER — Other Ambulatory Visit: Payer: Self-pay | Admitting: Internal Medicine

## 2023-12-10 ENCOUNTER — Other Ambulatory Visit (HOSPITAL_COMMUNITY): Payer: Self-pay

## 2023-12-10 MED ORDER — AMPHETAMINE-DEXTROAMPHETAMINE 5 MG PO TABS
5.0000 mg | ORAL_TABLET | Freq: Every day | ORAL | 0 refills | Status: DC | PRN
Start: 2023-12-10 — End: 2024-01-08
  Filled 2023-12-10: qty 30, 30d supply, fill #0

## 2023-12-10 MED ORDER — AMPHETAMINE-DEXTROAMPHET ER 20 MG PO CP24
20.0000 mg | ORAL_CAPSULE | Freq: Every morning | ORAL | 0 refills | Status: DC
Start: 2023-12-10 — End: 2024-01-08
  Filled 2023-12-10: qty 30, 30d supply, fill #0

## 2023-12-22 DIAGNOSIS — C44622 Squamous cell carcinoma of skin of right upper limb, including shoulder: Secondary | ICD-10-CM | POA: Diagnosis not present

## 2024-01-08 ENCOUNTER — Other Ambulatory Visit: Payer: Self-pay | Admitting: Nurse Practitioner

## 2024-01-08 ENCOUNTER — Other Ambulatory Visit (HOSPITAL_COMMUNITY): Payer: Self-pay

## 2024-01-08 MED ORDER — AMPHETAMINE-DEXTROAMPHETAMINE 5 MG PO TABS
5.0000 mg | ORAL_TABLET | Freq: Every day | ORAL | 0 refills | Status: DC | PRN
  Filled 2024-01-08: qty 30, 30d supply, fill #0

## 2024-01-08 MED ORDER — AMPHETAMINE-DEXTROAMPHET ER 20 MG PO CP24
20.0000 mg | ORAL_CAPSULE | Freq: Every morning | ORAL | 0 refills | Status: DC
  Filled 2024-01-08: qty 30, 30d supply, fill #0

## 2024-02-08 ENCOUNTER — Other Ambulatory Visit: Payer: Self-pay | Admitting: Internal Medicine

## 2024-02-09 ENCOUNTER — Other Ambulatory Visit (HOSPITAL_COMMUNITY): Payer: Self-pay

## 2024-02-09 MED ORDER — AMPHETAMINE-DEXTROAMPHET ER 20 MG PO CP24
20.0000 mg | ORAL_CAPSULE | Freq: Every morning | ORAL | 0 refills | Status: DC
  Filled 2024-02-09: qty 30, 30d supply, fill #0

## 2024-02-09 MED ORDER — AMPHETAMINE-DEXTROAMPHETAMINE 5 MG PO TABS
5.0000 mg | ORAL_TABLET | Freq: Every day | ORAL | 0 refills | Status: DC | PRN
  Filled 2024-02-09: qty 30, 30d supply, fill #0

## 2024-02-16 ENCOUNTER — Telehealth: Admitting: Physician Assistant

## 2024-02-16 DIAGNOSIS — L237 Allergic contact dermatitis due to plants, except food: Secondary | ICD-10-CM

## 2024-02-16 MED ORDER — PREDNISONE 10 MG PO TABS: ORAL_TABLET | ORAL | 0 refills | Status: AC

## 2024-02-16 NOTE — Progress Notes (Signed)

## 2024-02-16 NOTE — Progress Notes (Signed)
 I have spent 5 minutes in review of e-visit questionnaire, review and updating patient chart, medical decision making and response to patient.   Piedad Climes, PA-C

## 2024-03-09 ENCOUNTER — Other Ambulatory Visit: Payer: Self-pay | Admitting: Internal Medicine

## 2024-03-09 ENCOUNTER — Other Ambulatory Visit (HOSPITAL_COMMUNITY): Payer: Self-pay

## 2024-03-09 MED ORDER — AMPHETAMINE-DEXTROAMPHETAMINE 5 MG PO TABS
5.0000 mg | ORAL_TABLET | Freq: Every day | ORAL | 0 refills | Status: DC | PRN
Start: 2024-03-09 — End: 2024-04-04
  Filled 2024-03-09: qty 30, 30d supply, fill #0

## 2024-03-09 MED ORDER — AMPHETAMINE-DEXTROAMPHET ER 20 MG PO CP24
20.0000 mg | ORAL_CAPSULE | Freq: Every morning | ORAL | 0 refills | Status: DC
  Filled 2024-03-09: qty 30, 30d supply, fill #0

## 2024-03-18 ENCOUNTER — Ambulatory Visit: Payer: BC Managed Care – PPO | Admitting: Internal Medicine

## 2024-03-28 ENCOUNTER — Encounter: Payer: Self-pay | Admitting: Internal Medicine

## 2024-03-28 NOTE — Progress Notes (Signed)
 Subjective:    Patient ID: Melanie Castaneda, female    DOB: 06-18-1976, 48 y.o.   MRN: [id number]     HPI Melanie Castaneda is here for follow up of her chronic medical problems.  No concerns.  No changes in health.  Medications and allergies reviewed with patient and updated if appropriate.  Current Outpatient Medications on File Prior to Visit  Medication Sig Dispense Refill   amphetamine -dextroamphetamine  (ADDERALL  XR) 20 MG 24 hr capsule Take 1 capsule (20 mg total) by mouth every morning. 30 capsule 0   amphetamine -dextroamphetamine  (ADDERALL ) 5 MG tablet Take 1 tablet (5 mg total) by mouth daily as needed. 30 tablet 0   Calcium  Carb-Cholecalciferol  (CALCIUM  600/VITAMIN D3) 600-800 MG-UNIT TABS Take 1 tablet by mouth daily. 60 tablet    EPINEPHrine  (EPIPEN  2-PAK) 0.3 mg/0.3 mL IJ SOAJ injection Inject 0.3 mLs (0.3 mg total) into the muscle once. May repeat if needed 1 Device 1   levonorgestrel  (MIRENA , 52 MG,) 20 MCG/DAY IUD Mirena  20 mcg/24 hours (8 yrs) 52 mg intrauterine device  Take 1 device by intrauterine route.     levothyroxine  (SYNTHROID ) 25 MCG tablet Take 1 tablet (25 mcg total) by mouth daily before breakfast. Take 6 days of week only. 72 tablet 3   Multiple Vitamins-Minerals (MULTIVITAMIN) tablet Take 1 tablet by mouth daily.     No current facility-administered medications on file prior to visit.     Review of Systems  Constitutional:  Negative for fever.  Respiratory:  Negative for cough, shortness of breath and wheezing.   Cardiovascular:  Negative for chest pain, palpitations and leg swelling.  Neurological:  Negative for light-headedness and headaches.       Objective:   Vitals:   03/29/24 0849  BP: 110/78  Pulse: 92  Temp: 98.2 F (36.8 C)  SpO2: 99%   BP Readings from Last 3 Encounters:  03/29/24 110/78  09/18/23 114/72  03/18/23 110/78   Wt Readings from Last 3 Encounters:  03/29/24 145 lb (65.8 kg)  09/18/23 152 lb (68.9 kg)   03/18/23 159 lb (72.1 kg)   Body mass index is 21.41 kg/m.    Physical Exam Constitutional:      General: She is not in acute distress.    Appearance: Normal appearance.  HENT:     Head: Normocephalic and atraumatic.  Eyes:     Conjunctiva/sclera: Conjunctivae normal.  Cardiovascular:     Rate and Rhythm: Normal rate and regular rhythm.     Heart sounds: Normal heart sounds.  Pulmonary:     Effort: Pulmonary effort is normal. No respiratory distress.     Breath sounds: Normal breath sounds. No wheezing.  Musculoskeletal:     Cervical back: Neck supple.     Right lower leg: No edema.     Left lower leg: No edema.  Lymphadenopathy:     Cervical: No cervical adenopathy.  Skin:    General: Skin is warm and dry.     Findings: No rash.  Neurological:     Mental Status: She is alert. Mental status is at baseline.  Psychiatric:        Mood and Affect: Mood normal.        Behavior: Behavior normal.        Lab Results  Component Value Date   WBC 8.2 03/18/2023   HGB 13.8 03/18/2023   HCT 42.1 03/18/2023   PLT 251.0 03/18/2023   GLUCOSE 96 03/18/2023   CHOL 242 (H)  03/18/2023   TRIG 85.0 03/18/2023   HDL 113.30 03/18/2023   LDLDIRECT 99.5 06/08/2013   LDLCALC 111 (H) 03/18/2023   ALT 16 03/18/2023   AST 24 03/18/2023   NA 137 03/18/2023   K 3.7 03/18/2023   CL 102 03/18/2023   CREATININE 0.94 03/18/2023   BUN 10 03/18/2023   CO2 26 03/18/2023   TSH 0.52 03/18/2023   HGBA1C 5.6 03/18/2023     Assessment & Plan:    See Problem List for Assessment and Plan of chronic medical problems.

## 2024-03-28 NOTE — Patient Instructions (Addendum)
      Blood work was ordered.       Medications changes include :   None    An Ultrasound of your thyroid  was ordered and someone will call you to schedule an appointment.   A referral was ordered for GI for a colonoscopy.   Return in about 6 months (around 09/28/2024) for Physical Exam.

## 2024-03-29 ENCOUNTER — Encounter: Payer: Self-pay | Admitting: Internal Medicine

## 2024-03-29 ENCOUNTER — Ambulatory Visit (INDEPENDENT_AMBULATORY_CARE_PROVIDER_SITE_OTHER): Admitting: Internal Medicine

## 2024-03-29 VITALS — BP 110/78 | HR 92 | Temp 98.2°F | Ht 69.0 in | Wt 145.0 lb

## 2024-03-29 DIAGNOSIS — R41840 Attention and concentration deficit: Secondary | ICD-10-CM | POA: Diagnosis not present

## 2024-03-29 DIAGNOSIS — E042 Nontoxic multinodular goiter: Secondary | ICD-10-CM | POA: Diagnosis not present

## 2024-03-29 DIAGNOSIS — E039 Hypothyroidism, unspecified: Secondary | ICD-10-CM

## 2024-03-29 DIAGNOSIS — R7303 Prediabetes: Secondary | ICD-10-CM | POA: Diagnosis not present

## 2024-03-29 DIAGNOSIS — Z1211 Encounter for screening for malignant neoplasm of colon: Secondary | ICD-10-CM

## 2024-03-29 LAB — COMPREHENSIVE METABOLIC PANEL WITH GFR
ALT: 43 U/L — ABNORMAL HIGH (ref 0–35)
AST: 46 U/L — ABNORMAL HIGH (ref 0–37)
Albumin: 4.6 g/dL (ref 3.5–5.2)
Alkaline Phosphatase: 42 U/L (ref 39–117)
BUN: 6 mg/dL (ref 6–23)
CO2: 25 meq/L (ref 19–32)
Calcium: 9.2 mg/dL (ref 8.4–10.5)
Chloride: 100 meq/L (ref 96–112)
Creatinine, Ser: 0.8 mg/dL (ref 0.40–1.20)
GFR: 88.95 mL/min (ref >60.00)
Glucose, Bld: 98 mg/dL (ref 70–99)
Potassium: 4.3 meq/L (ref 3.5–5.1)
Sodium: 135 meq/L (ref 135–145)
Total Bilirubin: 0.3 mg/dL (ref 0.2–1.2)
Total Protein: 7.6 g/dL (ref 6.0–8.3)

## 2024-03-29 LAB — TSH: TSH: 0.7 u[IU]/mL (ref 0.35–5.50)

## 2024-03-29 LAB — HEMOGLOBIN A1C: Hgb A1c MFr Bld: 5.5 % (ref 4.6–6.5)

## 2024-03-29 NOTE — Assessment & Plan Note (Addendum)
 Chronic Denies any obvious changes in thyroid  size US  due to ensure stability Check TSH - ideally keep on lower end

## 2024-03-29 NOTE — Assessment & Plan Note (Signed)
 Chronic Lab Results  Component Value Date   HGBA1C 5.6 03/18/2023   Check a1c Low sugar / carb diet Continue regular exercise

## 2024-03-29 NOTE — Assessment & Plan Note (Signed)
 Chronic  Clinically euthyroid Check tsh and will titrate medication if needed Continue levothyroxine  25 mcg daily

## 2024-03-29 NOTE — Assessment & Plan Note (Signed)
Chronic Controlled  Continue Adderall XR 20 mg daily, Adderall 5 mg daily prn

## 2024-04-04 ENCOUNTER — Other Ambulatory Visit: Payer: Self-pay | Admitting: Internal Medicine

## 2024-04-04 ENCOUNTER — Other Ambulatory Visit (HOSPITAL_COMMUNITY): Payer: Self-pay

## 2024-04-04 MED ORDER — AMPHETAMINE-DEXTROAMPHET ER 20 MG PO CP24
20.0000 mg | ORAL_CAPSULE | Freq: Every morning | ORAL | 0 refills | Status: DC
  Filled 2024-04-04 – 2024-04-07 (×2): qty 30, 30d supply, fill #0

## 2024-04-04 MED ORDER — AMPHETAMINE-DEXTROAMPHETAMINE 5 MG PO TABS
5.0000 mg | ORAL_TABLET | Freq: Every day | ORAL | 0 refills | Status: DC | PRN
  Filled 2024-04-04 – 2024-04-07 (×2): qty 30, 30d supply, fill #0

## 2024-04-07 ENCOUNTER — Other Ambulatory Visit (HOSPITAL_COMMUNITY): Payer: Self-pay

## 2024-04-12 ENCOUNTER — Ambulatory Visit
Admission: RE | Admit: 2024-04-12 | Discharge: 2024-04-12 | Disposition: A | Discharge status: Home / Self Care | Source: Ambulatory Visit | Attending: Internal Medicine

## 2024-04-12 DIAGNOSIS — E039 Hypothyroidism, unspecified: Secondary | ICD-10-CM

## 2024-04-12 DIAGNOSIS — E042 Nontoxic multinodular goiter: Secondary | ICD-10-CM | POA: Diagnosis not present

## 2024-04-13 ENCOUNTER — Encounter: Payer: Self-pay | Admitting: Pediatrics

## 2024-04-17 ENCOUNTER — Encounter: Payer: Self-pay | Admitting: Internal Medicine

## 2024-04-18 ENCOUNTER — Encounter (HOSPITAL_COMMUNITY): Payer: Self-pay

## 2024-04-25 ENCOUNTER — Encounter: Payer: Self-pay | Admitting: Pediatrics

## 2024-04-25 ENCOUNTER — Ambulatory Visit: Admitting: *Deleted

## 2024-04-25 VITALS — Ht 69.0 in | Wt 141.0 lb

## 2024-04-25 DIAGNOSIS — Z1211 Encounter for screening for malignant neoplasm of colon: Secondary | ICD-10-CM

## 2024-04-25 MED ORDER — NA SULFATE-K SULFATE-MG SULF 17.5-3.13-1.6 GM/177ML PO SOLN
1.0000 | Freq: Once | ORAL | 0 refills | Status: AC
Start: 2024-04-25 — End: 2024-04-25

## 2024-04-25 NOTE — Progress Notes (Signed)
 Pt's name and DOB verified at the beginning of the pre-visit wit 2 identifiers   Pt denies any difficulty with ambulating,sitting, laying down or rolling side to side  Pt has no issues moving head neck or swallowing  No egg or soy allergy known to patient   No issues known to pt with past sedation with any surgeries or procedures  Patient denies ever being intubated  No FH of Malignant Hyperthermia  Pt is not on home 02   Pt is not on blood thinners   Pt denies issues with constipation   Pt is not on dialysis  Pt denise any abnormal heart rhythms   Pt denies any upcoming cardiac testing  Patient's chart reviewed by Rogena Class CNRA prior to pre-visit and patient appropriate for the [facility].  Pre-visit completed and red dot placed by patient's name on their procedure day (on provider's schedule).    Visit by phone  Pt states weight is 141 lb   IInstructions reviewed. Pt given  both [facility] main # and MD on call # prior to instructions.  Pt states understanding of instructions. Instructed pt to review instructions again prior to procedure and call main # given if has questions.. Pt states they will.   Instructed pt on where to find instructions on My Chart.

## 2024-05-05 ENCOUNTER — Other Ambulatory Visit (HOSPITAL_BASED_OUTPATIENT_CLINIC_OR_DEPARTMENT_OTHER): Payer: Self-pay

## 2024-05-05 ENCOUNTER — Other Ambulatory Visit: Payer: Self-pay | Admitting: Internal Medicine

## 2024-05-05 MED ORDER — AMPHETAMINE-DEXTROAMPHETAMINE 5 MG PO TABS
5.0000 mg | ORAL_TABLET | Freq: Every day | ORAL | 0 refills | Status: DC | PRN
  Filled 2024-05-05: qty 30, 30d supply, fill #0

## 2024-05-05 MED ORDER — AMPHETAMINE-DEXTROAMPHET ER 20 MG PO CP24
20.0000 mg | ORAL_CAPSULE | Freq: Every morning | ORAL | 0 refills | Status: DC
  Filled 2024-05-05: qty 30, 30d supply, fill #0

## 2024-05-16 ENCOUNTER — Encounter: Admitting: Pediatrics

## 2024-05-30 ENCOUNTER — Encounter: Payer: Self-pay | Admitting: Internal Medicine

## 2024-05-31 ENCOUNTER — Other Ambulatory Visit: Payer: Self-pay

## 2024-05-31 MED ORDER — LEVOTHYROXINE SODIUM 25 MCG PO TABS: 25.0000 ug | ORAL_TABLET | Freq: Every day | ORAL | 3 refills | Status: AC

## 2024-06-06 ENCOUNTER — Encounter: Payer: Self-pay | Admitting: Internal Medicine

## 2024-06-07 MED ORDER — AMPHETAMINE-DEXTROAMPHET ER 20 MG PO CP24: 20.0000 mg | ORAL_CAPSULE | Freq: Every morning | ORAL | 0 refills | Status: DC

## 2024-06-07 MED ORDER — AMPHETAMINE-DEXTROAMPHETAMINE 5 MG PO TABS: 5.0000 mg | ORAL_TABLET | Freq: Every day | ORAL | 0 refills | Status: DC | PRN

## 2024-06-20 ENCOUNTER — Other Ambulatory Visit: Payer: Self-pay

## 2024-06-20 ENCOUNTER — Emergency Department (HOSPITAL_BASED_OUTPATIENT_CLINIC_OR_DEPARTMENT_OTHER)

## 2024-06-20 ENCOUNTER — Telehealth: Payer: Self-pay | Admitting: Internal Medicine

## 2024-06-20 ENCOUNTER — Ambulatory Visit: Admission: EM | Admit: 2024-06-20 | Discharge: 2024-06-20 | Disposition: A | Discharge status: Home / Self Care

## 2024-06-20 ENCOUNTER — Emergency Department (HOSPITAL_BASED_OUTPATIENT_CLINIC_OR_DEPARTMENT_OTHER)
Admission: EM | Admit: 2024-06-20 | Discharge: 2024-06-20 | Discharge status: Against Medical Advice | Source: Ambulatory Visit | Attending: Emergency Medicine | Admitting: Emergency Medicine

## 2024-06-20 ENCOUNTER — Encounter: Payer: Self-pay | Admitting: Emergency Medicine

## 2024-06-20 ENCOUNTER — Encounter (HOSPITAL_BASED_OUTPATIENT_CLINIC_OR_DEPARTMENT_OTHER): Payer: Self-pay

## 2024-06-20 DIAGNOSIS — R202 Paresthesia of skin: Secondary | ICD-10-CM | POA: Diagnosis not present

## 2024-06-20 DIAGNOSIS — E876 Hypokalemia: Secondary | ICD-10-CM | POA: Diagnosis not present

## 2024-06-20 DIAGNOSIS — R29898 Other symptoms and signs involving the musculoskeletal system: Secondary | ICD-10-CM | POA: Insufficient documentation

## 2024-06-20 DIAGNOSIS — R200 Anesthesia of skin: Secondary | ICD-10-CM

## 2024-06-20 DIAGNOSIS — T63301A Toxic effect of unspecified spider venom, accidental (unintentional), initial encounter: Secondary | ICD-10-CM

## 2024-06-20 DIAGNOSIS — R531 Weakness: Secondary | ICD-10-CM | POA: Diagnosis not present

## 2024-06-20 DIAGNOSIS — M4722 Other spondylosis with radiculopathy, cervical region: Secondary | ICD-10-CM | POA: Diagnosis not present

## 2024-06-20 DIAGNOSIS — M4802 Spinal stenosis, cervical region: Secondary | ICD-10-CM | POA: Diagnosis not present

## 2024-06-20 DIAGNOSIS — R29818 Other symptoms and signs involving the nervous system: Secondary | ICD-10-CM | POA: Diagnosis not present

## 2024-06-20 DIAGNOSIS — E041 Nontoxic single thyroid nodule: Secondary | ICD-10-CM | POA: Diagnosis not present

## 2024-06-20 LAB — CBC WITH DIFFERENTIAL/PLATELET
Abs Immature Granulocytes: 0.01 K/uL (ref 0.00–0.07)
Basophils Absolute: 0 K/uL (ref 0.0–0.1)
Basophils Relative: 1 %
Eosinophils Absolute: 0 K/uL (ref 0.0–0.5)
Eosinophils Relative: 1 %
HCT: 41.6 % (ref 36.0–46.0)
Hemoglobin: 14.1 g/dL (ref 12.0–15.0)
Immature Granulocytes: 0 %
Lymphocytes Relative: 24 %
Lymphs Abs: 1.7 K/uL (ref 0.7–4.0)
MCH: 29.7 pg (ref 26.0–34.0)
MCHC: 33.9 g/dL (ref 30.0–36.0)
MCV: 87.8 fL (ref 80.0–100.0)
Monocytes Absolute: 0.5 K/uL (ref 0.1–1.0)
Monocytes Relative: 8 %
Neutro Abs: 4.7 K/uL (ref 1.7–7.7)
Neutrophils Relative %: 66 %
Platelets: 231 K/uL (ref 150–400)
RBC: 4.74 MIL/uL (ref 3.87–5.11)
RDW: 14.8 % (ref 11.5–15.5)
WBC: 7.1 K/uL (ref 4.0–10.5)
nRBC: 0 % (ref 0.0–0.2)

## 2024-06-20 LAB — CBG MONITORING, ED: Glucose-Capillary: 104 mg/dL — ABNORMAL HIGH (ref 70–99)

## 2024-06-20 LAB — COMPREHENSIVE METABOLIC PANEL WITH GFR
ALT: 42 U/L (ref 0–44)
ALT: 59 U/L — ABNORMAL HIGH (ref 0–44)
AST: 49 U/L — ABNORMAL HIGH (ref 15–41)
AST: 68 U/L — ABNORMAL HIGH (ref 15–41)
Albumin: 3.2 g/dL — ABNORMAL LOW (ref 3.5–5.0)
Albumin: 4.4 g/dL (ref 3.5–5.0)
Alkaline Phosphatase: 40 U/L (ref 38–126)
Alkaline Phosphatase: 56 U/L (ref 38–126)
Anion gap: 11 (ref 5–15)
Anion gap: 13 (ref 5–15)
BUN: <5 mg/dL — ABNORMAL LOW (ref 6–20)
BUN: 6 mg/dL (ref 6–20)
CO2: 20 mmol/L — ABNORMAL LOW (ref 22–32)
CO2: 24 mmol/L (ref 22–32)
Calcium: 6.3 mg/dL — CL (ref 8.9–10.3)
Calcium: 9.4 mg/dL (ref 8.9–10.3)
Chloride: 114 mmol/L — ABNORMAL HIGH (ref 98–111)
Chloride: 101 mmol/L (ref 98–111)
Creatinine, Ser: 0.43 mg/dL — ABNORMAL LOW (ref 0.44–1.00)
Creatinine, Ser: 0.65 mg/dL (ref 0.44–1.00)
GFR, Estimated: >60 mL/min (ref >60)
GFR, Estimated: >60 mL/min (ref >60)
Glucose, Bld: 68 mg/dL — ABNORMAL LOW (ref 70–99)
Glucose, Bld: 96 mg/dL (ref 70–99)
Potassium: 2.5 mmol/L — CL (ref 3.5–5.1)
Potassium: 4 mmol/L (ref 3.5–5.1)
Sodium: 145 mmol/L (ref 135–145)
Sodium: 138 mmol/L (ref 135–145)
Total Bilirubin: <0.2 mg/dL (ref 0.0–1.2)
Total Bilirubin: 0.3 mg/dL (ref 0.0–1.2)
Total Protein: 4.8 g/dL — ABNORMAL LOW (ref 6.5–8.1)
Total Protein: 6.8 g/dL (ref 6.5–8.1)

## 2024-06-20 LAB — PROTIME-INR
INR: 0.9 (ref 0.8–1.2)
Prothrombin Time: 12.6 s (ref 11.4–15.2)

## 2024-06-20 NOTE — ED Triage Notes (Signed)
 Woke up this morning with right arm numbness/tingling/weakness. LKW 2330 last night. Denies other symptoms. Ambulatory to room with steady gait.   symptoms appear to be from wrist down to hand, grip strength weak, arm strength normal.

## 2024-06-20 NOTE — ED Notes (Signed)
 Pt transferred pov to [facility] for MRI. Pt alert and oriented X 4 at the time of transfer. RR even and unlabored. No acute distress noted. Pt verbalized understanding of transfer instructions as discussed. Pt ambulatory to lobby at time of transfer.

## 2024-06-20 NOTE — ED Triage Notes (Signed)
 Pt presents with spider bite to left big toe that happened yesterday. Today toe is slight red and swollen. Her right arm became numb this morning.  Denies pain or SOB

## 2024-06-20 NOTE — ED Provider Notes (Signed)
 [facility]    CSN: [id number] Arrival date & time: 06/20/24  0800      History   Chief Complaint Chief Complaint  Patient presents with   Insect Bite    HPI MASYN FULLAM is a 48 y.o. female.   Discussed the use of AI scribe software for clinical note transcription with the patient, who gave verbal consent to proceed.   The patient presents with two chief complaints: a spider bite on the left great toe that occurred yesterday morning around 10:45 AM, and new onset right arm/hand numbness and weakness that began this morning.   Regarding the spider bite, the patient reports feeling it occur while putting on outdoor shoes. She reports seeing a small black spider run away. The bite site on the left great toe initially became swollen with two small marks visible. The patient applied ice to the area, which helped reduce the swelling. The bite site is described as a little itchy but not particularly painful or uncomfortable.  This morning, the patient woke up around 5:45 AM with sudden onset of numbness and weakness in the right hand. The patient describes the sensation as numb and tingling and reports difficulty functioning, stating that she was unable to hold her toothbrush or type with the right hand. There is no associated pain, but the entire area feels numb. The patient denies any recent heavy lifting or unusual activities with the right arm.  The patient denies fever, chills, body aches, neck pain, headache, nausea, vomiting, chest pain, shortness of breath, palpitations, or blurred vision. She reports feeling anxious about the situation. The symptoms are impacting daily activities, including difficulty typing, holding a toothbrush, and washing hair. The patient was scheduled to catch a flight for work today but decided to seek medical attention instead due to concerns about traveling with these symptoms.  The patient has a history of tendinitis in the hips but  denies any previous issues with the right wrist or hand. 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 Family history is significant for lung cancer in both grandparents related to smoking, but no known history of stroke or neurological conditions.  The following portions of the patient's history were reviewed and updated as appropriate: allergies, current medications, past family history, past medical history, past social history, past surgical history, and problem list.    Past Medical History:  Diagnosis Date   ADD (attention deficit disorder)    Nontoxic multinodular goiter    S/P nodule aspiration X 1    Patient Active Problem List   Diagnosis Date Noted   Dysmenorrhea 04/26/2019   Prediabetes 04/25/2019   ADD (attention deficit disorder) 01/20/2018   Hypothyroidism 01/19/2018   Nontoxic multinodular goiter 07/26/2008    Past Surgical History:  Procedure Laterality Date   Epidermoid cystectomy  2010    Dr Eletha   Thyroid  Nodule Aspiration  2008   benign nodular goiter   WISDOM TOOTH EXTRACTION      OB History   No obstetric history on file.      Home Medications    Prior to Admission medications   Medication Sig Start Date End Date Taking? Authorizing Provider  amphetamine -dextroamphetamine  (ADDERALL  XR) 20 MG 24 hr capsule Take 1 capsule (20 mg total) by mouth every morning. 06/07/24   Geofm Glade PARAS, MD  amphetamine -dextroamphetamine  (ADDERALL ) 5 MG tablet Take 1 tablet (5 mg total) by mouth daily as needed. 06/07/24   Geofm Glade PARAS, MD  Calcium  Carb-Cholecalciferol  (CALCIUM  600/VITAMIN D3) 600-800 MG-UNIT TABS Take 1 tablet by mouth daily. 01/16/16   Geofm Glade PARAS, MD  EPINEPHrine  (EPIPEN  2-PAK) 0.3 mg/0.3 mL IJ SOAJ injection Inject 0.3 mLs (0.3 mg total) into the muscle once. May repeat if needed 03/21/15   Tish Elsie FALCON, MD  levonorgestrel  (MIRENA , 52 MG,) 20 MCG/DAY IUD Mirena  20 mcg/24 hours (8 yrs) 52 mg intrauterine device  Take 1 device by  intrauterine route.    [provider]  levothyroxine  (SYNTHROID ) 25 MCG tablet Take 1 tablet (25 mcg total) by mouth daily before breakfast. Take 6 days of week only. 05/31/24   Geofm Glade PARAS, MD  Multiple Vitamins-Minerals (MULTIVITAMIN) tablet Take 1 tablet by mouth daily. 01/16/16   Geofm Glade PARAS, MD    Family History Family History  Problem Relation Age of Onset   Colon polyps Mother    Hyperlipidemia Father        Her advanced cholesterol panel (MMR lipoprotein) reveals that LDLDL  goal is less than 120.   Barrett's esophagus Father    Bipolar disorder Sister    Breast cancer Maternal Aunt    Breast cancer Paternal Aunt    Diabetes Maternal Grandfather    Hyperlipidemia Paternal Grandmother    Thyroid  disease Paternal Grandmother        hyperthyroidism   Breast cancer Paternal Grandmother    Lung cancer Paternal Grandfather        smoker   Thyroid  disease Paternal Grandfather    Heart attack Paternal Grandfather        in 23s   Diabetes Other        MGGM   Breast cancer Other        MGGM   Breast cancer Other        PGGM   Thyroid  disease Other        PG aunt   Stroke Neg Hx    Colon cancer Neg Hx    Esophageal cancer Neg Hx    Rectal cancer Neg Hx    Stomach cancer Neg Hx     Social History Social History   Tobacco Use   Smoking status: Former    Current packs/day: 0.50    Average packs/day: 0.5 packs/day for 11.4 years (5.7 ttl pk-yrs)    Types: Cigarettes    Start date: 01/10/2013   Tobacco comments:    smoked [id number], up to 1/2 ppd  Vaping Use   Vaping status: Never Used  Substance Use Topics   Alcohol use: Yes    Alcohol/week: 3.0 standard drinks of alcohol    Types: 3 Glasses of wine per week    Comment: Socially; 1X/ week   Drug use: No     Allergies   Patient has no known allergies.   Review of Systems Review of Systems  Constitutional:  Negative for fever.  Eyes:  Negative for visual disturbance.  Respiratory:  Negative for  shortness of breath.   Cardiovascular:  Negative for chest pain, palpitations and leg swelling.  Gastrointestinal:  Negative for nausea and vomiting.  Musculoskeletal:  Negative for myalgias, neck pain and neck stiffness.  Skin:  Positive for wound (spider bite to left great toe). Negative for rash.  Neurological:  Positive for weakness (right hand) and numbness (right hand). Negative for dizziness and headaches.  Psychiatric/Behavioral:  The patient is nervous/anxious.   All other systems reviewed and are negative.    Physical Exam Triage Vital Signs ED Triage Vitals  Encounter  Vitals Group     BP 06/20/24 0810 (!) 143/94     Girls Systolic BP Percentile --      Girls Diastolic BP Percentile --      Boys Systolic BP Percentile --      Boys Diastolic BP Percentile --      Pulse Rate 06/20/24 0810 98     Resp 06/20/24 0810 16     Temp 06/20/24 0810 97.7 F (36.5 C)     Temp Source 06/20/24 0810 Oral     SpO2 06/20/24 0810 97 %     Weight --      Height --      Head Circumference --      Peak Flow --      Pain Score 06/20/24 0813 0     Pain Loc --      Pain Education --      Exclude from Growth Chart --    No data found.  Updated Vital Signs BP (!) 143/94 (BP Location: Right Arm)   Pulse 98   Temp 97.7 F (36.5 C) (Oral)   Resp 16   SpO2 97%   Visual Acuity Right Eye Distance:   Left Eye Distance:   Bilateral Distance:    Right Eye Near:   Left Eye Near:    Bilateral Near:     Physical Exam Vitals reviewed.  Constitutional:      General: She is awake. She is not in acute distress.    Appearance: Normal appearance. She is well-developed, well-groomed and normal weight. She is not ill-appearing, toxic-appearing or diaphoretic.  HENT:     Head: Normocephalic.     Right Ear: Hearing normal.     Left Ear: Hearing normal.     Nose: Nose normal.     Mouth/Throat:     Mouth: Mucous membranes are moist.  Eyes:     General: Vision grossly intact.      Conjunctiva/sclera: Conjunctivae normal.     Pupils: Pupils are equal, round, and reactive to light.  Cardiovascular:     Rate and Rhythm: Normal rate and regular rhythm.     Pulses:          Dorsalis pedis pulses are 2+ on the right side.     Heart sounds: Normal heart sounds.  Pulmonary:     Effort: Pulmonary effort is normal.     Breath sounds: Normal breath sounds and air entry.  Musculoskeletal:        General: Normal range of motion.     Cervical back: Full passive range of motion without pain, normal range of motion and neck supple.     Right lower leg: No edema.     Left lower leg: No edema.     Right foot: Normal range of motion.  Feet:     Right foot:     Skin integrity: No ulcer, blister, skin breakdown, erythema or warmth.     Comments: two small puncture marks noted to the great left toe with minimal swelling Skin:    General: Skin is warm and dry.     Findings: No rash.  Neurological:     General: No focal deficit present.     Mental Status: She is alert and oriented to person, place, and time.     Sensory: Sensation is intact. No sensory deficit (normal sensation of right hand).     Motor: Weakness (decreased grip strength to right hand) present. No pronator drift.  Coordination: Coordination is intact.     Gait: Gait is intact.  Psychiatric:        Speech: Speech normal.        Behavior: Behavior is cooperative.         UC Treatments / Results  Labs (all labs ordered are listed, but only abnormal results are displayed) Labs Reviewed - No data to display  EKG   Radiology No results found.  Procedures Procedures (including critical care time)  Medications Ordered in UC Medications - No data to display  Initial Impression / Assessment and Plan / UC Course  I have reviewed the triage vital signs and the nursing notes.  Pertinent labs & imaging results that were available during my care of the patient were reviewed by me and considered in my  medical decision making (see chart for details).     Patient presents with sudden onset of numbness, tingling, and weakness in the right hand that began this morning. Symptoms are isolated to the hand and wrist with decreased grip strength compared to the left. There is no recent trauma, overuse, or associated neurological symptoms such as headache or vision changes. Despite low risk factors for cerebrovascular disease, the acute and focal nature of the symptoms raises concern for a possible transient ischemic attack or early stroke. Patient was referred to [facility] ER for immediate evaluation and neuroimaging to rule out an acute neurological event.  Additionally, the patient reports a suspected spider bite to the left great toe that occurred yesterday. Exam shows two small puncture marks with minimal swelling and no signs of infection or systemic reaction. Patient denies significant pain or concerning symptoms. Presentation is consistent with a benign, likely non-venomous spider bite. Patient was reassured, advised to continue ice for comfort, and instructed to monitor for signs of infection or worsening symptoms.   The patient presents with symptoms and exam findings that warrant a higher level of care and further evaluation. Given the clinical picture, emergency department (ED) assessment is recommended for advanced diagnostics and management. The patient is currently stable and appropriate for transport via private vehicle. The patient is alert, oriented, demonstrates clear mental status, good insight into their illness, and sound judgment in making decisions regarding their care. The risks, rationale, and need for ED evaluation were discussed, and the patient is agreeable to the plan.  Documentation was completed with the aid of voice recognition software. Transcription may contain typographical errors.  Final Clinical Impressions(s) / UC Diagnoses   Final diagnoses:  Numbness of  right hand  Spider bite, accidental or unintentional, initial encounter   Discharge Instructions   None    ED Prescriptions   None    PDMP not reviewed this encounter.   [place]ola [place], [place] 06/20/24 [id number]

## 2024-06-20 NOTE — ED Notes (Signed)
 Patient noted to not show up to [facility] for MRI. Remains on [facility] trackboard.

## 2024-06-20 NOTE — Telephone Encounter (Unsigned)
 Copied from CRM [id number]. Topic: Clinical - Medical Advice >> Jun 20, 2024  3:07 PM Gennette ORN wrote: Reason for CRM: Patient wasn't able to get a diagnosis for numbness and tingling in right arm at er. So patient wants more advice from Dr Geofm.

## 2024-06-20 NOTE — ED Provider Notes (Signed)
 [facility] Provider Note   CSN: [id number] Arrival date & time: 06/20/24  0901     Patient presents with: Numbness   Melanie Castaneda is a 48 y.o. female.   Patient complains of right hand and wrist weakness.  Patient reports she woke up this morning with decreased use of her right hand.  Patient reports she is experienced similar symptoms in the past when she has slept on her arm but that normally resolves.  Patient reports that this area of weakness has persist.  Patient went to urgent care for evaluation and was sent here to be seen patient states she also was stung by a bee yesterday on her foot.  Patient reports being unable to brush her teeth with her right hand.  Patient states she did scrub a pool yesterday for about 3 hours.  The history is provided by the patient. No language interpreter was used.       Prior to Admission medications   Medication Sig Start Date End Date Taking? Authorizing Provider  amphetamine -dextroamphetamine  (ADDERALL  XR) 20 MG 24 hr capsule Take 1 capsule (20 mg total) by mouth every morning. 06/07/24   Geofm Glade PARAS, MD  amphetamine -dextroamphetamine  (ADDERALL ) 5 MG tablet Take 1 tablet (5 mg total) by mouth daily as needed. 06/07/24   Geofm Glade PARAS, MD  Calcium  Carb-Cholecalciferol  (CALCIUM  600/VITAMIN D3) 600-800 MG-UNIT TABS Take 1 tablet by mouth daily. 01/16/16   Geofm Glade PARAS, MD  EPINEPHrine  (EPIPEN  2-PAK) 0.3 mg/0.3 mL IJ SOAJ injection Inject 0.3 mLs (0.3 mg total) into the muscle once. May repeat if needed 03/21/15   Tish Elsie FALCON, MD  levonorgestrel  (MIRENA , 52 MG,) 20 MCG/DAY IUD Mirena  20 mcg/24 hours (8 yrs) 52 mg intrauterine device  Take 1 device by intrauterine route.    [provider]  levothyroxine  (SYNTHROID ) 25 MCG tablet Take 1 tablet (25 mcg total) by mouth daily before breakfast. Take 6 days of week only. 05/31/24   Geofm Glade PARAS, MD  Multiple Vitamins-Minerals  (MULTIVITAMIN) tablet Take 1 tablet by mouth daily. 01/16/16   Geofm Glade PARAS, MD    Allergies: Prednisone     Review of Systems  All other systems reviewed and are negative.   Updated Vital Signs BP (!) 130/94   Pulse 81   Temp 97.9 F (36.6 C) (Oral)   Resp 17   Ht 5' 9 (1.753 m)   Wt 61.2 kg   SpO2 100%   BMI 19.94 kg/m   Physical Exam Vitals and nursing note reviewed.  Constitutional:      Appearance: She is well-developed.  HENT:     Head: Normocephalic.     Right Ear: External ear normal.     Left Ear: External ear normal.     Nose: Nose normal.     Mouth/Throat:     Mouth: Mucous membranes are moist.  Cardiovascular:     Rate and Rhythm: Normal rate and regular rhythm.  Pulmonary:     Effort: Pulmonary effort is normal.  Abdominal:     General: There is no distension.  Musculoskeletal:        General: Normal range of motion.     Cervical back: Normal range of motion and neck supple.  Skin:    General: Skin is warm.  Neurological:     General: No focal deficit present.     Mental Status: She is alert and oriented to person, place, and time.  Comments: Decreased grip right hand decreased sensation thumb and index finger,     (all labs ordered are listed, but only abnormal results are displayed) Labs Reviewed  COMPREHENSIVE METABOLIC PANEL WITH GFR - Abnormal; Notable for the following components:      Result Value   Potassium 2.5 (*)    Chloride 114 (*)    CO2 20 (*)    Glucose, Bld 68 (*)    BUN <5 (*)    Creatinine, Ser 0.43 (*)    Calcium  6.3 (*)    Total Protein 4.8 (*)    Albumin 3.2 (*)    AST 49 (*)    All other components within normal limits  CBG MONITORING, ED - Abnormal; Notable for the following components:   Glucose-Capillary 104 (*)    All other components within normal limits  CBC WITH DIFFERENTIAL/PLATELET  PROTIME-INR  COMPREHENSIVE METABOLIC PANEL WITH GFR    EKG: EKG Interpretation Date/Time:  Monday June 20 2024  09:13:03 EDT Ventricular Rate:  95 PR Interval:  133 QRS Duration:  73 QT Interval:  446 QTC Calculation: 561 R Axis:   73  Text Interpretation: Sinus rhythm Biatrial enlargement Borderline T wave abnormalities Prolonged QT interval No old tracing to compare Confirmed by Dean Clarity [phone]) on 06/20/2024 9:32:38 AM  Radiology: CT Head Wo Contrast Result Date: 06/20/2024 CLINICAL DATA:  Neuro deficit, acute stroke suspected, new onset right hand numbness and weakness EXAM: CT HEAD WITHOUT CONTRAST TECHNIQUE: Contiguous axial images were obtained from the base of the skull through the vertex without intravenous contrast. RADIATION DOSE REDUCTION: This exam was performed according to the departmental dose-optimization program which includes automated exposure control, adjustment of the mA and/or kV according to patient size and/or use of iterative reconstruction technique. COMPARISON:  None Available. FINDINGS: Brain: No evidence of acute infarction, hemorrhage, hydrocephalus, extra-axial collection or mass lesion/mass effect. Vascular: No hyperdense vessel or unexpected calcification. Skull: Normal. Negative for fracture or focal lesion. Sinuses/Orbits: No acute finding. Other: None. IMPRESSION: No acute intracranial pathology. Electronically Signed   By: Marolyn JONETTA Jaksch M.D.   On: 06/20/2024 11:25   CT Cervical Spine Wo Contrast Result Date: 06/20/2024 CLINICAL DATA:  Cervical radiculopathy, no red flags New onset right arm and hand numbness with weakness that began this morning. EXAM: CT CERVICAL SPINE WITHOUT CONTRAST TECHNIQUE: Multidetector CT imaging of the cervical spine was performed without intravenous contrast. Multiplanar CT image reconstructions were also generated. RADIATION DOSE REDUCTION: This exam was performed according to the departmental dose-optimization program which includes automated exposure control, adjustment of the mA and/or kV according to patient size and/or use of  iterative reconstruction technique. COMPARISON:  No prior cervical spine imaging available. Thyroid  ultrasound 04/12/2024. FINDINGS: Alignment: Straightening without focal angulation or listhesis. Skull base and vertebrae: No evidence of acute cervical spine fracture, traumatic subluxation or aggressive osseous lesion. Soft tissues and spinal canal: No prevertebral fluid or swelling. No visible canal hematoma. Disc levels: Mild spondylosis with disc space narrowing and uncinate spurring greatest at C5-6. No large disc herniation or high-grade foraminal narrowing demonstrated by CT. Upper chest: Clear lung apices. Other: Right thyroid  nodule measuring up to 1.5 cm on coronal image 15/13, previously evaluated by ultrasound. IMPRESSION: 1. No evidence of acute cervical spine fracture, traumatic subluxation or static signs of instability. 2. Mild cervical spondylosis greatest at C5-6. No large disc herniation or high-grade foraminal narrowing demonstrated by CT. Electronically Signed   By: Elsie Perone M.D.   On: 06/20/2024 10:49  Procedures   Medications Ordered in the ED - No data to display                                  Medical Decision Making Patient complains of weakness to her right hand.  She has had a similar sensation when she has slept on her arm in the past however this area has persist.  Patient reports difficulty using right hand.  Amount and/or Complexity of Data Reviewed Labs: ordered. Decision-making details documented in ED Course.    Details: Labs ordered reviewed and interpreted.  Chemistries and CBC are normal Radiology: ordered and independent interpretation performed. Decision-making details documented in ED Course.    Details: CT head ordered reviewed and interpreted CT head shows no acute abnormality CT cervical spine shows C5-C6 bone spurring.  Risk Risk Details: Dr. Dean in to see and examine.  Patient seems to have hand weakness in the C6 distribution of her  hand.  Patient counseled on results.  Patient will go to [facility] for MRI of cervical spine and MRI of her neck.  Patient is sent to [facility] for [facility] evaluation.  Dr. Freddi made aware.  Orders placed        Final diagnoses:  Weakness  Right hand weakness  Paresthesia    ED Discharge Orders     None          Fl[place] Sonny MARLA DEVONNA 06/20/24 1333    Dean Clarity, MD 06/20/24 1523

## 2024-06-20 NOTE — Discharge Instructions (Addendum)
 Go to [facility] hospital ED for MRI's

## 2024-06-23 ENCOUNTER — Ambulatory Visit (INDEPENDENT_AMBULATORY_CARE_PROVIDER_SITE_OTHER): Admitting: Family Medicine

## 2024-06-23 ENCOUNTER — Encounter: Payer: Self-pay | Admitting: Family Medicine

## 2024-06-23 VITALS — BP 130/80 | HR 91 | Temp 98.5°F | Ht 69.0 in | Wt 139.4 lb

## 2024-06-23 DIAGNOSIS — M25531 Pain in right wrist: Secondary | ICD-10-CM

## 2024-06-23 DIAGNOSIS — M654 Radial styloid tenosynovitis [de Quervain]: Secondary | ICD-10-CM

## 2024-06-23 NOTE — Progress Notes (Signed)
 Acute Office Visit  Subjective:     Patient ID: Melanie Castaneda, female    DOB: 1976/01/03, 48 y.o.   MRN: [id number]  Chief Complaint  Patient presents with   Hospitalization Follow-up    HPI  Discussed the use of AI scribe software for clinical note transcription with the patient, who gave verbal consent to proceed.  History of Present Illness Melanie Castaneda is a 48 year old female who presents with numbness and tingling in the right arm following a spider bite.  Right upper extremity paresthesia and weakness - Numbness and tingling in the right arm following a spider bite on Sunday, most concentrated in the right hand - By Monday, significant weakness in the right wrist with numbness and tingling extending from the wrist distally - Persistent weakness and some tingling remain, but no pain - Gradual improvement in symptoms with exercises to improve mobility - No chest pain, shortness of breath, or palpitations  Spider bite - Spider bite occurred on Sunday, preceding onset of neurological symptoms in the right arm  Diagnostic evaluation - Head and neck CT scan performed at [facility] was normal - Stroke tests were normal - Declined MRI at [facility] - ER notes and results reviewed by me  Therapeutic interventions and symptom management - Uses a wrist brace during the day and while sleeping, providing stability and improvement - Lidocaine cream provides more relief than diclofenac cream - Exercises performed to improve mobility  Occupational concerns - Concerned about carpal tunnel syndrome due to frequent typing and travel for work  Autonomic symptoms - Mother observed fluctuating pulse and blood pressure during initial evaluation, attributed to agitation     ROS Per HPI      Objective:    BP 130/80 (BP Location: Left Arm, Patient Position: Sitting)   Pulse 91   Temp 98.5 F (36.9 C) (Temporal)   Ht 5' 9 (1.753 m)   Wt 139 lb  6.4 oz (63.2 kg)   SpO2 100%   BMI 20.59 kg/m    Physical Exam Vitals and nursing note reviewed.  Constitutional:      General: She is not in acute distress.    Appearance: Normal appearance. She is normal weight.  HENT:     Head: Normocephalic and atraumatic.     Right Ear: External ear normal.     Left Ear: External ear normal.     Nose: Nose normal.     Mouth/Throat:     Mouth: Mucous membranes are moist.     Pharynx: Oropharynx is clear.  Eyes:     Extraocular Movements: Extraocular movements intact.     Pupils: Pupils are equal, round, and reactive to light.  Cardiovascular:     Rate and Rhythm: Normal rate and regular rhythm.     Pulses: Normal pulses.     Heart sounds: Normal heart sounds.  Pulmonary:     Effort: Pulmonary effort is normal. No respiratory distress.     Breath sounds: Normal breath sounds. No wheezing, rhonchi or rales.  Musculoskeletal:        General: Normal range of motion.       Arms:     Cervical back: Normal range of motion.     Right lower leg: No edema.     Left lower leg: No edema.     Comments: Area of mild swelling and tenderness, positive Finkelstein's, negative for erythema, warmth, bruising, obvious deformity  Lymphadenopathy:  Cervical: No cervical adenopathy.  Neurological:     General: No focal deficit present.     Mental Status: She is alert and oriented to person, place, and time.  Psychiatric:        Mood and Affect: Mood normal.        Thought Content: Thought content normal.     No results found for any visits on 06/23/24.      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 Wrist brace use improved symptoms. - Advise continued use of thumb spica, especially during sleep. - Continue using lidocaine cream for symptomatic relief. - Consider Arnica cream as alternative  to diclofenac. - Report if no improvement within a week.     No orders of the defined types were placed in this encounter.    No orders of the defined types were placed in this encounter.   Return if symptoms worsen or fail to improve.  Corean LITTIE Ku, FNP

## 2024-06-23 NOTE — Patient Instructions (Signed)
 Thumb spica brace at least when you are sleeping  Follow-up with me for new or worsening symptoms.

## 2024-06-29 DIAGNOSIS — M25531 Pain in right wrist: Secondary | ICD-10-CM | POA: Insufficient documentation

## 2024-06-29 DIAGNOSIS — M654 Radial styloid tenosynovitis [de Quervain]: Secondary | ICD-10-CM | POA: Insufficient documentation

## 2024-07-05 DIAGNOSIS — Z124 Encounter for screening for malignant neoplasm of cervix: Secondary | ICD-10-CM | POA: Diagnosis not present

## 2024-07-05 DIAGNOSIS — Z1231 Encounter for screening mammogram for malignant neoplasm of breast: Secondary | ICD-10-CM | POA: Diagnosis not present

## 2024-07-05 DIAGNOSIS — Z01419 Encounter for gynecological examination (general) (routine) without abnormal findings: Secondary | ICD-10-CM | POA: Diagnosis not present

## 2024-07-05 DIAGNOSIS — Z1151 Encounter for screening for human papillomavirus (HPV): Secondary | ICD-10-CM | POA: Diagnosis not present

## 2024-07-08 ENCOUNTER — Telehealth: Payer: Self-pay | Admitting: Gastroenterology

## 2024-07-08 ENCOUNTER — Other Ambulatory Visit: Payer: Self-pay | Admitting: Internal Medicine

## 2024-07-08 NOTE — Telephone Encounter (Signed)
 Patient called and stated that she has a procedure schedule for the 4th of August and was took the medication called goodie twice this week and was unsure if that was ok. Patient is also stating that this procedure schedule for the 4 th of August was a reschedule and her prep instruction don't match with the time, so she is wondering if a nurse can call her back to see what time she needs to actually start. Patient is requesting a call back. Please advise.

## 2024-07-08 NOTE — Telephone Encounter (Signed)
 Returned the patient's phone call. Discussed that she should not take any more Goody powders, NSAIDS etc until after the procedure and discussed prep times for Gevorg Brum August 4. PT verbalized understanding.

## 2024-07-10 NOTE — Progress Notes (Unsigned)
 [facility] History and Physical   Primary Care Physician:  Geofm Glade PARAS, MD   Reason for Procedure:  Colorectal cancer screening  Plan:    Screening colonoscopy     HPI: Melanie Castaneda is a 48 y.o. female undergoing screening colonoscopy for colorectal cancer screening.  This is the patient's first colonoscopy.  Chart documents a family history of polyps in the patient's mother.  No family history of colorectal cancer.  Patient denies current symptoms of change in bowel habits or rectal bleeding at the time of this exam.   Past Medical History:  Diagnosis Date   ADD (attention deficit disorder)    Nontoxic multinodular goiter    S/P nodule aspiration X 1    Past Surgical History:  Procedure Laterality Date   Epidermoid cystectomy  2010    Dr Eletha   Thyroid  Nodule Aspiration  2008   benign nodular goiter   WISDOM TOOTH EXTRACTION      Prior to Admission medications   Medication Sig Start Date End Date Taking? Authorizing Provider  amphetamine -dextroamphetamine  (ADDERALL  XR) 20 MG 24 hr capsule Take 1 capsule by mouth every morning. 07/08/24   Geofm Glade PARAS, MD  amphetamine -dextroamphetamine  (ADDERALL ) 5 MG tablet Take 1 tablet by mouth daily as needed. 07/08/24   Geofm Glade PARAS, MD  Calcium  Carb-Cholecalciferol  (CALCIUM  600/VITAMIN D3) 600-800 MG-UNIT TABS Take 1 tablet by mouth daily. 01/16/16   Geofm Glade PARAS, MD  EPINEPHrine  (EPIPEN  2-PAK) 0.3 mg/0.3 mL IJ SOAJ injection Inject 0.3 mLs (0.3 mg total) into the muscle once. May repeat if needed 03/21/15   Tish Elsie FALCON, MD  levonorgestrel  (MIRENA , 52 MG,) 20 MCG/DAY IUD Mirena  20 mcg/24 hours (8 yrs) 52 mg intrauterine device  Take 1 device by intrauterine route.    [provider]  levothyroxine  (SYNTHROID ) 25 MCG tablet Take 1 tablet (25 mcg total) by mouth daily before breakfast. Take 6 days of week only. 05/31/24   Geofm Glade PARAS, MD  Multiple Vitamins-Minerals (MULTIVITAMIN) tablet Take 1  tablet by mouth daily. 01/16/16   Geofm Glade PARAS, MD    Current Outpatient Medications  Medication Sig Dispense Refill   amphetamine -dextroamphetamine  (ADDERALL  XR) 20 MG 24 hr capsule Take 1 capsule by mouth every morning. 30 capsule 0   levonorgestrel  (MIRENA , 52 MG,) 20 MCG/DAY IUD Mirena  20 mcg/24 hours (8 yrs) 52 mg intrauterine device  Take 1 device by intrauterine route.     levothyroxine  (SYNTHROID ) 25 MCG tablet Take 1 tablet (25 mcg total) by mouth daily before breakfast. Take 6 days of week only. 72 tablet 3   Multiple Vitamins-Minerals (MULTIVITAMIN) tablet Take 1 tablet by mouth daily.     amphetamine -dextroamphetamine  (ADDERALL ) 5 MG tablet Take 1 tablet by mouth daily as needed. 30 tablet 0   Calcium  Carb-Cholecalciferol  (CALCIUM  600/VITAMIN D3) 600-800 MG-UNIT TABS Take 1 tablet by mouth daily. 60 tablet    EPINEPHrine  (EPIPEN  2-PAK) 0.3 mg/0.3 mL IJ SOAJ injection Inject 0.3 mLs (0.3 mg total) into the muscle once. May repeat if needed 1 Device 1   Current Facility-Administered Medications  Medication Dose Route Frequency Provider Last Rate Last Admin   0.9 %  sodium chloride  infusion  500 mL Intravenous Once Yoniel Arkwright M, MD        Allergies as of 07/11/2024 - Review Complete 07/11/2024  Allergen Reaction Noted   Prednisone  Other (See Comments) 06/20/2024    Family History  Problem Relation Age of Onset   Colon polyps Mother  Hyperlipidemia Father        Her advanced cholesterol panel (MMR lipoprotein) reveals that LDLDL  goal is less than 120.   Barrett's esophagus Father    ADD / ADHD Father    Bipolar disorder Sister    Breast cancer Maternal Aunt    Breast cancer Paternal Aunt    Diabetes Maternal Grandfather    Hyperlipidemia Paternal Grandmother    Thyroid  disease Paternal Grandmother        hyperthyroidism   Breast cancer Paternal Grandmother    Lung cancer Paternal Grandfather        smoker   Thyroid  disease Paternal Grandfather    Heart attack  Paternal Grandfather        in 42s   Diabetes Other        MGGM   Breast cancer Other        MGGM   Breast cancer Other        PGGM   Thyroid  disease Other        PG aunt   Stroke Neg Hx    Colon cancer Neg Hx    Esophageal cancer Neg Hx    Rectal cancer Neg Hx    Stomach cancer Neg Hx     Social History   Socioeconomic History   Marital status: Single    Spouse name: Not on file   Number of children: Not on file   Years of education: Not on file   Highest education level: Master's degree (e.g., MA, MS, MEng, MEd, MSW, MBA)  Occupational History   Not on file  Tobacco Use   Smoking status: Former    Current packs/day: 0.50    Average packs/day: 0.5 packs/day for 11.5 years (5.7 ttl pk-yrs)    Types: Cigarettes    Start date: 01/10/2013   Smokeless tobacco: Not on file   Tobacco comments:    smoked [phone], up to 1/2 ppd  Vaping Use   Vaping status: Some Days  Substance and Sexual Activity   Alcohol use: Yes    Alcohol/week: 5.0 standard drinks of alcohol    Types: 5 Standard drinks or equivalent per week    Comment: Socially; 1X/ week   Drug use: No   Sexual activity: Yes    Birth control/protection: I.U.D., None  Other Topics Concern   Not on file  Social History Narrative   Exercise:  regular   Social Drivers of Health   Financial Resource Strain: Low Risk  (03/29/2024)   Overall Financial Resource Strain (CARDIA)    Difficulty of Paying Living Expenses: Not very hard  Food Insecurity: No Food Insecurity (03/29/2024)   Hunger Vital Sign    Worried About Running Out of Food in the Last Year: Never true    Ran Out of Food in the Last Year: Never true  Transportation Needs: No Transportation Needs (03/29/2024)   PRAPARE - Administrator, Civil Service (Medical): No    Lack of Transportation (Non-Medical): No  Physical Activity: Insufficiently Active (03/29/2024)   Exercise Vital Sign    Days of Exercise per Week: 3 days    Minutes of Exercise  per Session: 40 min  Stress: No Stress Concern Present (03/29/2024)   [employer] of Occupational Health - Occupational Stress Questionnaire    Feeling of Stress : Only a little  Social Connections: Unknown (03/29/2024)   Social Connection and Isolation Panel    Frequency of Communication with Friends and Family: More than three times a  week    Frequency of Social Gatherings with Friends and Family: More than three times a week    Attends Religious Services: Patient declined    Database administrator or Organizations: No    Attends Engineer, structural: Not on file    Marital Status: Patient declined  Intimate Partner Violence: Not on file    Review of Systems:  All other review of systems negative except as mentioned in the HPI.  Physical Exam: Vital signs BP 123/89   Pulse 77   Temp 98 F (36.7 C)   Resp 19   Ht 5' 9 (1.753 m)   Wt 141 lb (64 kg)   SpO2 95%   BMI 20.82 kg/m   General:   Alert,  Well-developed, well-nourished, pleasant and cooperative in NAD Airway:  Mallampati 1 Lungs:  Clear throughout to auscultation.   Heart:  Regular rate and rhythm; no murmurs, clicks, rubs,  or gallops. Abdomen:  Soft, nontender and nondistended. Normal bowel sounds.   Neuro/Psych:  Normal mood and affect. A and O x 3  Inocente Hausen, MD [facility]

## 2024-07-11 ENCOUNTER — Ambulatory Visit (AMBULATORY_SURGERY_CENTER): Admitting: Pediatrics

## 2024-07-11 ENCOUNTER — Encounter: Payer: Self-pay | Admitting: Pediatrics

## 2024-07-11 VITALS — BP 118/80 | HR 68 | Temp 98.0°F | Resp 18 | Ht 69.0 in | Wt 141.0 lb

## 2024-07-11 DIAGNOSIS — Z1211 Encounter for screening for malignant neoplasm of colon: Secondary | ICD-10-CM

## 2024-07-11 DIAGNOSIS — K648 Other hemorrhoids: Secondary | ICD-10-CM | POA: Diagnosis not present

## 2024-07-11 DIAGNOSIS — K6389 Other specified diseases of intestine: Secondary | ICD-10-CM | POA: Diagnosis not present

## 2024-07-11 DIAGNOSIS — Z83719 Family history of colon polyps, unspecified: Secondary | ICD-10-CM

## 2024-07-11 DIAGNOSIS — D125 Benign neoplasm of sigmoid colon: Secondary | ICD-10-CM

## 2024-07-11 DIAGNOSIS — D128 Benign neoplasm of rectum: Secondary | ICD-10-CM

## 2024-07-11 MED ORDER — SODIUM CHLORIDE 0.9 % IV SOLN: 500.0000 mL | Freq: Once | INTRAVENOUS | Status: DC

## 2024-07-11 NOTE — Progress Notes (Signed)
 Pt's states no medical or surgical changes since previsit or office visit.

## 2024-07-11 NOTE — Op Note (Signed)
 [facility] Patient Name: Melanie Castaneda Procedure Date: 07/11/2024 10:03 AM MRN: [id number] Endoscopist: Inocente Hausen , MD, [id number] Age: 48 Referring MD:  Date of Birth: 06/30/1976 Gender: Female Account #: [id number] Procedure:                Colonoscopy Indications:              Colon cancer screening in patient at increased                            risk: Family history of 1st-degree relative with                            colon polyps, This is the patient's first                            colonoscopy Medicines:                Monitored Anesthesia Care Procedure:                Pre-Anesthesia Assessment:                           - Prior to the procedure, a History and Physical                            was performed, and patient medications and                            allergies were reviewed. The patient's tolerance of                            previous anesthesia was also reviewed. The risks                            and benefits of the procedure and the sedation                            options and risks were discussed with the patient.                            All questions were answered, and informed consent                            was obtained. Prior Anticoagulants: The patient has                            taken no anticoagulant or antiplatelet agents. ASA                            Grade Assessment: II - A patient with mild systemic                            disease. After reviewing the risks and benefits,  the patient was deemed in satisfactory condition to                            undergo the procedure.                           After obtaining informed consent, the colonoscope                            was passed under direct vision. Throughout the                            procedure, the patient's blood pressure, pulse, and                            oxygen saturations were monitored continuously. The                             Olympus Scope [id number] was introduced through the                            anus and advanced to the cecum, identified by                            appendiceal orifice and ileocecal valve. The                            colonoscopy was performed without difficulty. The                            patient tolerated the procedure well. The quality                            of the bowel preparation was good. The ileocecal                            valve, appendiceal orifice, and rectum were                            photographed. Scope In: 10:14:33 AM Scope Out: 10:33:35 AM Scope Withdrawal Time: 0 hours 14 minutes 34 seconds  Total Procedure Duration: 0 hours 19 minutes 2 seconds  Findings:                 The perianal and digital rectal examinations were                            normal. Pertinent negatives include normal                            sphincter tone and no palpable rectal lesions.                           Three sessile polyps were found in the  recto-sigmoid colon. The polyps were 3 to 4 mm in                            size. These polyps were removed with a cold biopsy                            forceps. Resection and retrieval were complete.                           Internal hemorrhoids were found during retroflexion. Complications:            No immediate complications. Estimated blood loss:                            Minimal. Estimated Blood Loss:     Estimated blood loss was minimal. Impression:               - Three 3 to 4 mm polyps at the recto-sigmoid                            colon, removed with a cold biopsy forceps. Resected                            and retrieved.                           - Internal hemorrhoids. Recommendation:           - Discharge patient to home (ambulatory).                           - Await pathology results.                           - Repeat colonoscopy for surveillance based on                             pathology results.                           - The findings and recommendations were discussed                            with the patient's family.                           - Return to referring physician.                           - Patient has a contact number available for                            emergencies. The signs and symptoms of potential                            delayed complications were discussed with the  patient. Return to normal activities tomorrow.                            Written discharge instructions were provided to the                            patient. Inocente Hausen, MD 07/11/2024 10:37:49 AM This report has been signed electronically.

## 2024-07-11 NOTE — Patient Instructions (Addendum)
 Discharge patient to home (ambulatory).                           - Await pathology results.                           - Repeat colonoscopy for surveillance based on                            pathology results.                           - The findings and recommendations were discussed                            with the patient's family.                           - Return to referring physician.                           - Patient has a contact number available for                            emergencies. The signs and symptoms of potential                            delayed complications were discussed with the                            patient. Return to normal activities tomorrow.                            Written discharge instructions were provided to the                            patient.  Handout on polyps given.  YOU HAD AN ENDOSCOPIC PROCEDURE TODAY AT THE [facility]:   Refer to the procedure report that was given to you for any specific questions about what was found during the examination.  If the procedure report does not answer your questions, please call your gastroenterologist to clarify.  If you requested that your care partner not be given the details of your procedure findings, then the procedure report has been included in a sealed envelope for you to review at your convenience later.  YOU SHOULD EXPECT: Some feelings of bloating in the abdomen. Passage of more gas than usual.  Walking can help get rid of the air that was put into your GI tract during the procedure and reduce the bloating. If you had a lower endoscopy (such as a colonoscopy or flexible sigmoidoscopy) you may notice spotting of blood in your stool or on the toilet paper. If you underwent a bowel prep for your procedure, you may not have a normal bowel movement for a few days.  Please Note:  You might notice some irritation and congestion in your nose or some drainage.  This is from the oxygen  used during your procedure.  There is no  need for concern and it should clear up in a day or so.  SYMPTOMS TO REPORT IMMEDIATELY:  Following lower endoscopy (colonoscopy or flexible sigmoidoscopy):  Excessive amounts of blood in the stool  Significant tenderness or worsening of abdominal pains  Swelling of the abdomen that is new, acute  Fever of 100F or higher   For urgent or emergent issues, a gastroenterologist can be reached at any hour by calling [phone]. Do not use MyChart messaging for urgent concerns.    DIET:  We do recommend a small meal at first, but then you may proceed to your regular diet.  Drink plenty of fluids but you should avoid alcoholic beverages for 24 hours.  ACTIVITY:  You should plan to take it easy for the rest of today and you should NOT DRIVE or use heavy machinery until tomorrow (because of the sedation medicines used during the test).    FOLLOW UP: Our staff will call the number listed on your records the next business day following your procedure.  We will call around 7:15- 8:00 am to check on you and address any questions or concerns that you may have regarding the information given to you following your procedure. If we do not reach you, we will leave a message.     If any biopsies were taken you will be contacted by phone or by letter within the next 1-3 weeks.  Please call us  at [phone] if you have not heard about the biopsies in 3 weeks.    SIGNATURES/CONFIDENTIALITY: You and/or your care partner have signed paperwork which will be entered into your electronic medical record.  These signatures attest to the fact that that the information above on your After Visit Summary has been reviewed and is understood.  Full responsibility of the confidentiality of this discharge information lies with you and/or your care-partner.

## 2024-07-11 NOTE — Progress Notes (Signed)
 Called to room to assist during endoscopic procedure.  Patient ID and intended procedure confirmed with present staff. Received instructions for my participation in the procedure from the performing physician.

## 2024-07-11 NOTE — Progress Notes (Signed)
 Pt resting comfortably. VSS. Airway intact. SBAR complete to RN. All questions answered.

## 2024-07-12 ENCOUNTER — Telehealth: Payer: Self-pay | Admitting: *Deleted

## 2024-07-12 NOTE — Telephone Encounter (Signed)
  Follow up Call-     07/11/2024    9:50 AM  Call back number  Post procedure Call Back phone  # [phone]  Permission to leave phone message Yes     Patient questions:  Message left to call if necessary.

## 2024-07-13 LAB — SURGICAL PATHOLOGY

## 2024-07-14 ENCOUNTER — Ambulatory Visit: Payer: Self-pay | Admitting: Pediatrics

## 2024-07-31 ENCOUNTER — Encounter: Payer: Self-pay | Admitting: Internal Medicine

## 2024-07-31 DIAGNOSIS — T63464A Toxic effect of venom of wasps, undetermined, initial encounter: Secondary | ICD-10-CM | POA: Diagnosis not present

## 2024-07-31 DIAGNOSIS — R000 Tachycardia, unspecified: Secondary | ICD-10-CM | POA: Diagnosis not present

## 2024-07-31 DIAGNOSIS — T63441A Toxic effect of venom of bees, accidental (unintentional), initial encounter: Secondary | ICD-10-CM | POA: Diagnosis not present

## 2024-07-31 DIAGNOSIS — R42 Dizziness and giddiness: Secondary | ICD-10-CM | POA: Diagnosis not present

## 2024-07-31 DIAGNOSIS — T7840XA Allergy, unspecified, initial encounter: Secondary | ICD-10-CM | POA: Diagnosis not present

## 2024-07-31 DIAGNOSIS — W57XXXA Bitten or stung by nonvenomous insect and other nonvenomous arthropods, initial encounter: Secondary | ICD-10-CM | POA: Diagnosis not present

## 2024-07-31 DIAGNOSIS — I959 Hypotension, unspecified: Secondary | ICD-10-CM | POA: Diagnosis not present

## 2024-08-02 ENCOUNTER — Other Ambulatory Visit: Payer: Self-pay

## 2024-08-02 MED ORDER — EPINEPHRINE 0.3 MG/0.3ML IJ SOAJ: 0.3000 mg | Freq: Once | INTRAMUSCULAR | 1 refills | Status: AC

## 2024-08-03 NOTE — Patient Instructions (Signed)
 SABRA

## 2024-08-03 NOTE — Progress Notes (Unsigned)
 Virtual Visit via Video Note  I connected with Melanie Castaneda on 08/04/24 at  9:10 AM EDT by a video enabled telemedicine application and verified that I am speaking with the correct person using two identifiers.   I discussed the limitations of evaluation and management by telemedicine and the availability of in person appointments. The patient expressed understanding and agreed to proceed.  Present for the visit:  Myself, Dr Glade Hope, Chasey Cousineau.  The patient is currently at home and I am in the office.    No referring provider.    History of Present Illness: This visit is for follow-up for her recent emergency room visit.   7/14-initially went to urgent care and then was redirected to the emergency room.  She presented with right hand and wrist weakness, numnbess.  She woke up that morning with decreased use of her right hand due to weakness.  There is no improvement with changing positions or shaking her extremity out so she went to urgent care.  She was sent to the emergency room because she was stung by bee the day before on her foot.  She had also scrubbed a pool for 3 hours the day before.  In ED CT head was normal.   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  No spinal fracture, injury.  She deferred going to the hospital for an MRI.  She did try using a brace, lidocaine cream and exercises which did help.  She did follow-up and was diagnosed with de Quervain's syndrome.  On 8/24 she went to [facility] emergency department in [place].  She presented with an allergic reaction.  She sustained a yellowjacket sting to her left first metacarpal shortly prior to arriving in the emergency room.  She is brought in by EMS.  She had used an EpiPen  at home prior to EMS arriving.  EMS gave her another dose of epinephrine .  She had swelling around the area of the sting as well as pain but denied any  systemic symptoms including throat swelling or shortness of breath.  She is allergic to steroids.  Vital signs were stable.  Her first MCP on her left hand was swollen, red.  Normal pulses.  Tender to touch.  She received IV fluids, Tylenol, hydrocodone/acetaminophen, Benadryl, famotidine and Toradol.  She was discharged home with prescription for hydroxyzine 25 mg every 6 hours as needed x 3 days, epinephrine  pen and famotidine 20 mg p.o. twice daily x 3 days  Before she left the ED her lip and eyelid swelling has resolved.  Her hand swelling improved over the next 48 hours and resolved.  No symptoms since then.  Has EpiPen  at home and always has 1 with her.   Social History   Socioeconomic History   Marital status: Single    Spouse name: Not on file   Number of children: Not on file   Years of education: Not on file   Highest education level: Master's degree (e.g., MA, MS, MEng, MEd, MSW, MBA)  Occupational History   Not on file  Tobacco Use   Smoking status: Former    Current packs/day: 0.50    Average packs/day: 0.5 packs/day for 11.6 years (5.8 ttl pk-yrs)    Types: Cigarettes    Start date: 01/10/2013   Smokeless tobacco: Not on file   Tobacco comments:    smoked 1998-2014, up to 1/2 ppd  Vaping Use   Vaping status:  Some Days  Substance and Sexual Activity   Alcohol use: Yes    Alcohol/week: 5.0 standard drinks of alcohol    Types: 5 Standard drinks or equivalent per week    Comment: Socially; 1X/ week   Drug use: No   Sexual activity: Yes    Birth control/protection: I.U.D., None  Other Topics Concern   Not on file  Social History Narrative   Exercise:  regular   Social Drivers of Health   Financial Resource Strain: Low Risk  (08/02/2024)   Overall Financial Resource Strain (CARDIA)    Difficulty of Paying Living Expenses: Not hard at all  Food Insecurity: No Food Insecurity (08/02/2024)   Hunger Vital Sign    Worried About Running Out of Food in the Last Year:  Never true    Ran Out of Food in the Last Year: Never true  Transportation Needs: No Transportation Needs (08/02/2024)   PRAPARE - Administrator, Civil Service (Medical): No    Lack of Transportation (Non-Medical): No  Physical Activity: Sufficiently Active (08/02/2024)   Exercise Vital Sign    Days of Exercise per Week: 3 days    Minutes of Exercise per Session: 50 min  Stress: No Stress Concern Present (08/02/2024)   [employer] of Occupational Health - Occupational Stress Questionnaire    Feeling of Stress: Only a little  Social Connections: Socially Isolated (08/02/2024)   Social Connection and Isolation Panel    Frequency of Communication with Friends and Family: More than three times a week    Frequency of Social Gatherings with Friends and Family: More than three times a week    Attends Religious Services: Patient declined    Database administrator or Organizations: No    Attends Engineer, structural: Not on file    Marital Status: Divorced     Observations/Objective: Appears well in NAD Breathing normally Skin warm and dry without rash  Assessment and Plan:  See Problem List for Assessment and Plan of chronic medical problems.   Follow Up Instructions:    I discussed the assessment and treatment plan with the patient. The patient was provided an opportunity to ask questions and all were answered. The patient agreed with the plan and demonstrated an understanding of the instructions.   The patient was advised to call back or seek an in-person evaluation if the symptoms worsen or if the condition fails to improve as anticipated.    Glade JINNY Hope, MD

## 2024-08-04 ENCOUNTER — Telehealth: Admitting: Internal Medicine

## 2024-08-04 ENCOUNTER — Encounter: Payer: Self-pay | Admitting: Internal Medicine

## 2024-08-04 DIAGNOSIS — R41840 Attention and concentration deficit: Secondary | ICD-10-CM | POA: Diagnosis not present

## 2024-08-04 DIAGNOSIS — T63441A Toxic effect of venom of bees, accidental (unintentional), initial encounter: Secondary | ICD-10-CM

## 2024-08-04 MED ORDER — AMPHETAMINE-DEXTROAMPHETAMINE 5 MG PO TABS: 1.0000 | ORAL_TABLET | Freq: Every day | ORAL | 0 refills | Status: DC | PRN

## 2024-08-04 MED ORDER — EPINEPHRINE 0.3 MG/0.3ML IJ SOAJ: 0.3000 mg | INTRAMUSCULAR | Status: AC | PRN

## 2024-08-04 MED ORDER — AMPHETAMINE-DEXTROAMPHET ER 20 MG PO CP24: 20.0000 mg | ORAL_CAPSULE | Freq: Every morning | ORAL | 0 refills | Status: DC

## 2024-08-04 NOTE — Assessment & Plan Note (Signed)
 Recent allergic reaction to yellowjacket sting Also has same reaction to fire ants Swelling where she was stung, lips and eyelids swelled, she has felt very hot and started to have hives Had EpiPen  x 2 prior to ED and then was given fluids, antihistamines, pain medication Symptoms completely resolved and does not have any concerning symptoms at this time Has EpiPen  at home and always carries it with her

## 2024-08-04 NOTE — Assessment & Plan Note (Signed)
 Chronic Controlled  Continue Adderall  XR 20 mg daily, Adderall  5 mg daily prn Due for refill-[place] controlled substance database checked Prescription sent to pharmacy

## 2024-09-03 ENCOUNTER — Encounter: Payer: Self-pay | Admitting: Internal Medicine

## 2024-09-04 MED ORDER — AMPHETAMINE-DEXTROAMPHET ER 20 MG PO CP24: 20.0000 mg | ORAL_CAPSULE | Freq: Every morning | ORAL | 0 refills | Status: DC

## 2024-09-04 MED ORDER — AMPHETAMINE-DEXTROAMPHETAMINE 5 MG PO TABS: 1.0000 | ORAL_TABLET | Freq: Every day | ORAL | 0 refills | Status: DC | PRN

## 2024-09-05 DIAGNOSIS — F4320 Adjustment disorder, unspecified: Secondary | ICD-10-CM | POA: Diagnosis not present

## 2024-10-03 ENCOUNTER — Encounter: Payer: Self-pay | Admitting: Internal Medicine

## 2024-10-04 MED ORDER — AMPHETAMINE-DEXTROAMPHET ER 20 MG PO CP24: 20.0000 mg | ORAL_CAPSULE | Freq: Every morning | ORAL | 0 refills | Status: DC

## 2024-10-04 MED ORDER — AMPHETAMINE-DEXTROAMPHETAMINE 5 MG PO TABS: 1.0000 | ORAL_TABLET | Freq: Every day | ORAL | 0 refills | Status: DC | PRN

## 2024-10-05 ENCOUNTER — Encounter: Payer: Self-pay | Admitting: Internal Medicine

## 2024-10-05 NOTE — Progress Notes (Unsigned)
 Subjective:    Patient ID: Melanie Castaneda, female    DOB: Jul 09, 1976, 48 y.o.   MRN: [id number]      HPI Melanie Castaneda is here for a Physical exam and her chronic medical problems.   Overall doing well-no concerns.  One of her friends had been and has been living with her for about 6 weeks.  She does have cats.  She has noticed the last few weeks that she is having allergy symptoms which she believes is related to the cats.  She states itchy, swollen eyes, nasal congestion, headaches and hives.  She is taking Zyrtec daily.  She is using a Vicks nasal spray.   Medications and allergies reviewed with patient and updated if appropriate.  Current Outpatient Medications on File Prior to Visit  Medication Sig Dispense Refill   amphetamine -dextroamphetamine  (ADDERALL  XR) 20 MG 24 hr capsule Take 1 capsule (20 mg total) by mouth every morning. 30 capsule 0   amphetamine -dextroamphetamine  (ADDERALL ) 5 MG tablet Take 1 tablet (5 mg total) by mouth daily as needed. 30 tablet 0   Calcium  Carb-Cholecalciferol  (CALCIUM  600/VITAMIN D3) 600-800 MG-UNIT TABS Take 1 tablet by mouth daily. 60 tablet    EPINEPHrine  0.3 mg/0.3 mL IJ SOAJ injection Inject 0.3 mg into the muscle as needed for anaphylaxis.     levonorgestrel  (MIRENA , 52 MG,) 20 MCG/DAY IUD Mirena  20 mcg/24 hours (8 yrs) 52 mg intrauterine device  Take 1 device by intrauterine route.     levothyroxine  (SYNTHROID ) 25 MCG tablet Take 1 tablet (25 mcg total) by mouth daily before breakfast. Take 6 days of week only. 72 tablet 3   Multiple Vitamins-Minerals (MULTIVITAMIN) tablet Take 1 tablet by mouth daily.     No current facility-administered medications on file prior to visit.    Review of Systems  Constitutional:  Negative for fever.  HENT:  Positive for congestion.   Eyes:  Positive for itching (and swelling). Negative for visual disturbance.  Respiratory:  Negative for cough, shortness of breath and wheezing.   Cardiovascular:   Negative for chest pain, palpitations and leg swelling.  Gastrointestinal:  Negative for abdominal pain, blood in stool, constipation and diarrhea.       No gerd  Genitourinary:  Negative for dysuria.  Musculoskeletal:  Negative for arthralgias and back pain.  Skin:  Positive for rash (some hives from cats).  Neurological:  Positive for headaches (related to cat allergy). Negative for light-headedness.  Psychiatric/Behavioral:  Negative for dysphoric mood. The patient is not nervous/anxious.        Objective:   Vitals:   10/06/24 0828  BP: 136/74  Pulse: (!) 104  Temp: 98 F (36.7 C)  SpO2: 100%   Filed Weights   10/06/24 0828  Weight: 139 lb (63 kg)   Body mass index is 20.53 kg/m.  BP Readings from Last 3 Encounters:  10/06/24 136/74  07/11/24 118/80  06/23/24 130/80    Wt Readings from Last 3 Encounters:  10/06/24 139 lb (63 kg)  07/11/24 141 lb (64 kg)  06/23/24 139 lb 6.4 oz (63.2 kg)       Physical Exam Constitutional: She appears well-developed and well-nourished. No distress.  HENT:  Head: Normocephalic and atraumatic.  Right Ear: External ear normal. Normal ear canal and TM Left Ear: External ear normal.  Normal ear canal and TM Mouth/Throat: Oropharynx is clear and moist.  Eyes: Conjunctivae normal.  Neck: Neck supple. No tracheal deviation present. No thyromegaly present.  No carotid bruit  Cardiovascular: Normal rate, regular rhythm and normal heart sounds.   No murmur heard.  No edema. Pulmonary/Chest: Effort normal and breath sounds normal. No respiratory distress. She has no wheezes. She has no rales.  Breast: deferred   Abdominal: Soft. She exhibits no distension. There is no tenderness.  Lymphadenopathy: She has no cervical adenopathy.  Skin: Skin is warm and dry. She is not diaphoretic.  Psychiatric: She has a normal mood and affect. Her behavior is normal.     Lab Results  Component Value Date   WBC 7.1 06/20/2024   HGB 14.1  06/20/2024   HCT 41.6 06/20/2024   PLT 231 06/20/2024   GLUCOSE 96 06/20/2024   CHOL 242 (H) 03/18/2023   TRIG 85.0 03/18/2023   HDL 113.30 03/18/2023   LDLDIRECT 99.5 06/08/2013   LDLCALC 111 (H) 03/18/2023   ALT 59 (H) 06/20/2024   AST 68 (H) 06/20/2024   NA 138 06/20/2024   K 4.0 06/20/2024   CL 101 06/20/2024   CREATININE 0.65 06/20/2024   BUN 6 06/20/2024   CO2 24 06/20/2024   TSH 0.70 03/29/2024   INR 0.9 06/20/2024   HGBA1C 5.5 03/29/2024         Assessment & Plan:   Physical exam: Screening blood work  ordered Exercise regular Weight normal Substance abuse  none   Reviewed recommended immunizations.  Tdap given   Health Maintenance  Topic Date Due   Hepatitis B Vaccines 19-59 Average Risk (1 of 3 - 19+ 3-dose series) Never done   DTaP/Tdap/Td (2 - Td or Tdap) 03/08/2020   Mammogram  11/05/2021   Cervical Cancer Screening (HPV/Pap Cotest)  05/11/2022   Influenza Vaccine  07/08/2024   COVID-19 Vaccine (3 - 2025-26 season) 10/21/2024 (Originally 08/08/2024)   Colonoscopy  07/11/2034   Hepatitis C Screening  Completed   HIV Screening  Completed   Pneumococcal Vaccine  Aged Out   HPV VACCINES  Aged Out   Meningococcal B Vaccine  Aged Out      Mammogram and Pap smear up-to-date-will request records    See Problem List for Assessment and Plan of chronic medical problems.

## 2024-10-05 NOTE — Patient Instructions (Addendum)
 Tetanus vaccine given.   Blood work was ordered.       Medications changes include :   None     Return in about 6 months (around 04/06/2025) for follow up.    Health Maintenance, Female Adopting a healthy lifestyle and getting preventive care are important in promoting health and wellness. Ask your health care provider about: The right schedule for you to have regular tests and exams. Things you can do on your own to prevent diseases and keep yourself healthy. What should I know about diet, weight, and exercise? Eat a healthy diet  Eat a diet that includes plenty of vegetables, fruits, low-fat dairy products, and lean protein. Do not eat a lot of foods that are high in solid fats, added sugars, or sodium. Maintain a healthy weight Body mass index (BMI) is used to identify weight problems. It estimates body fat based on height and weight. Your health care provider can help determine your BMI and help you achieve or maintain a healthy weight. Get regular exercise Get regular exercise. This is one of the most important things you can do for your health. Most adults should: Exercise for at least 150 minutes each week. The exercise should increase your heart rate and make you sweat (moderate-intensity exercise). Do strengthening exercises at least twice a week. This is in addition to the moderate-intensity exercise. Spend less time sitting. Even light physical activity can be beneficial. Watch cholesterol and blood lipids Have your blood tested for lipids and cholesterol at 46 years of age, then have this test every 5 years. Have your cholesterol levels checked more often if: Your lipid or cholesterol levels are high. You are older than 46 years of age. You are at high risk for heart disease. What should I know about cancer screening? Depending on your health history and family history, you may need to have cancer screening at various ages. This may include screening for: Breast  cancer. Cervical cancer. Colorectal cancer. Skin cancer. Lung cancer. What should I know about heart disease, diabetes, and high blood pressure? Blood pressure and heart disease High blood pressure causes heart disease and increases the risk of stroke. This is more likely to develop in people who have high blood pressure readings or are overweight. Have your blood pressure checked: Every 3-5 years if you are 46-46 years of age. Every year if you are 40 years old or older. Diabetes Have regular diabetes screenings. This checks your fasting blood sugar level. Have the screening done: Once every three years after age 46 if you are at a normal weight and have a low risk for diabetes. More often and at a younger age if you are overweight or have a high risk for diabetes. What should I know about preventing infection? Hepatitis B If you have a higher risk for hepatitis B, you should be screened for this virus. Talk with your health care provider to find out if you are at risk for hepatitis B infection. Hepatitis C Testing is recommended for: Everyone born from [birth year] through 1963. Anyone with known risk factors for hepatitis C. Sexually transmitted infections (STIs) Get screened for STIs, including gonorrhea and chlamydia, if: You are sexually active and are younger than 46 years of age. You are older than 46 years of age and your health care provider tells you that you are at risk for this type of infection. Your sexual activity has changed since you were last screened, and you are at increased risk for  chlamydia or gonorrhea. Ask your health care provider if you are at risk. Ask your health care provider about whether you are at high risk for HIV. Your health care provider may recommend a prescription medicine to help prevent HIV infection. If you choose to take medicine to prevent HIV, you should first get tested for HIV. You should then be tested every 3 months for as long as you are taking  the medicine. Pregnancy If you are about to stop having your period (premenopausal) and you may become pregnant, seek counseling before you get pregnant. Take 400 to 800 micrograms (mcg) of folic acid every day if you become pregnant. Ask for birth control (contraception) if you want to prevent pregnancy. Osteoporosis and menopause Osteoporosis is a disease in which the bones lose minerals and strength with aging. This can result in bone fractures. If you are 55 years old or older, or if you are at risk for osteoporosis and fractures, ask your health care provider if you should: Be screened for bone loss. Take a calcium  or vitamin D supplement to lower your risk of fractures. Be given hormone replacement therapy (HRT) to treat symptoms of menopause. Follow these instructions at home: Alcohol use Do not drink alcohol if: Your health care provider tells you not to drink. You are pregnant, may be pregnant, or are planning to become pregnant. If you drink alcohol: Limit how much you have to: 0-1 drink a day. Know how much alcohol is in your drink. In the [place] one drink equals one 12 oz bottle of beer (355 mL), one 5 oz glass of wine (148 mL), or one 1 oz glass of hard liquor (44 mL). Lifestyle Do not use any products that contain nicotine or tobacco. These products include cigarettes, chewing tobacco, and vaping devices, such as e-cigarettes. If you need help quitting, ask your health care provider. Do not use street drugs. Do not share needles. Ask your health care provider for help if you need support or information about quitting drugs. General instructions Schedule regular health, dental, and eye exams. Stay current with your vaccines. Tell your health care provider if: You often feel depressed. You have ever been abused or do not feel safe at home. Summary Adopting a healthy lifestyle and getting preventive care are important in promoting health and wellness. Follow your health  care provider's instructions about healthy diet, exercising, and getting tested or screened for diseases. Follow your health care provider's instructions on monitoring your cholesterol and blood pressure. This information is not intended to replace advice given to you by your health care provider. Make sure you discuss any questions you have with your health care provider. Document Revised: 04/15/2021 Document Reviewed: 04/15/2021 Elsevier Patient Education  2024 [employer].

## 2024-10-06 ENCOUNTER — Ambulatory Visit: Payer: Self-pay | Admitting: Internal Medicine

## 2024-10-06 ENCOUNTER — Ambulatory Visit (INDEPENDENT_AMBULATORY_CARE_PROVIDER_SITE_OTHER): Admitting: Internal Medicine

## 2024-10-06 VITALS — BP 136/74 | HR 104 | Temp 98.0°F | Ht 69.0 in | Wt 139.0 lb

## 2024-10-06 DIAGNOSIS — R41840 Attention and concentration deficit: Secondary | ICD-10-CM

## 2024-10-06 DIAGNOSIS — E039 Hypothyroidism, unspecified: Secondary | ICD-10-CM

## 2024-10-06 DIAGNOSIS — Z0000 Encounter for general adult medical examination without abnormal findings: Secondary | ICD-10-CM | POA: Diagnosis not present

## 2024-10-06 DIAGNOSIS — Z23 Encounter for immunization: Secondary | ICD-10-CM

## 2024-10-06 DIAGNOSIS — R7303 Prediabetes: Secondary | ICD-10-CM

## 2024-10-06 DIAGNOSIS — J3081 Allergic rhinitis due to animal (cat) (dog) hair and dander: Secondary | ICD-10-CM

## 2024-10-06 LAB — COMPREHENSIVE METABOLIC PANEL WITH GFR
ALT: 19 U/L (ref 0–35)
AST: 21 U/L (ref 0–37)
Albumin: 4.6 g/dL (ref 3.5–5.2)
Alkaline Phosphatase: 54 U/L (ref 39–117)
BUN: 10 mg/dL (ref 6–23)
CO2: 27 meq/L (ref 19–32)
Calcium: 9.3 mg/dL (ref 8.4–10.5)
Chloride: 102 meq/L (ref 96–112)
Creatinine, Ser: 0.81 mg/dL (ref 0.40–1.20)
GFR: 87.31 mL/min (ref >60.00)
Glucose, Bld: 90 mg/dL (ref 70–99)
Potassium: 4.4 meq/L (ref 3.5–5.1)
Sodium: 137 meq/L (ref 135–145)
Total Bilirubin: 0.2 mg/dL (ref 0.2–1.2)
Total Protein: 7.7 g/dL (ref 6.0–8.3)

## 2024-10-06 LAB — CBC
HCT: 45.1 % (ref 36.0–46.0)
Hemoglobin: 14.9 g/dL (ref 12.0–15.0)
MCHC: 33 g/dL (ref 30.0–36.0)
MCV: 90.6 fl (ref 78.0–100.0)
Platelets: 275 K/uL (ref 150.0–400.0)
RBC: 4.98 Mil/uL (ref 3.87–5.11)
RDW: 14 % (ref 11.5–15.5)
WBC: 7.1 K/uL (ref 4.0–10.5)

## 2024-10-06 LAB — LIPID PANEL
Cholesterol: 261 mg/dL — ABNORMAL HIGH (ref 0–200)
HDL: 139.3 mg/dL (ref >39.00)
LDL Cholesterol: 108 mg/dL — ABNORMAL HIGH (ref 0–99)
NonHDL: 121.56
Total CHOL/HDL Ratio: 2
Triglycerides: 68 mg/dL (ref 0.0–149.0)
VLDL: 13.6 mg/dL (ref 0.0–40.0)

## 2024-10-06 LAB — TSH: TSH: 0.52 u[IU]/mL (ref 0.35–5.50)

## 2024-10-06 LAB — HEMOGLOBIN A1C: Hgb A1c MFr Bld: 5.7 % (ref 4.6–6.5)

## 2024-10-06 MED ORDER — FLUTICASONE PROPIONATE 50 MCG/ACT NA SUSP: 2.0000 | Freq: Every day | NASAL | 6 refills | Status: DC

## 2024-10-06 NOTE — Assessment & Plan Note (Signed)
 Chronic Controlled  Continue Adderall  XR 20 mg daily, Adderall  5 mg daily prn PDMP reviewed

## 2024-10-06 NOTE — Assessment & Plan Note (Signed)
 New Had a friend moving recently and has And has been having allergy symptoms-most likely this is a cat allergy They will be moving around eventually Continue Zyrtec, start Flonase nasal spray-sent to pharmacy Can add in Pepcid or Singulair if symptoms are not controlled No need for formal testing since this makes the most sense by history She will let me know if her symptoms are not controlled

## 2024-10-06 NOTE — Assessment & Plan Note (Signed)
 Chronic  Clinically euthyroid Check tsh and will titrate medication if needed Continue levothyroxine  25 mcg daily

## 2024-10-06 NOTE — Assessment & Plan Note (Signed)
 Chronic Lab Results  Component Value Date   HGBA1C 5.5 03/29/2024   Check a1c Low sugar / carb diet Continue regular exercise

## 2024-10-06 NOTE — Addendum Note (Signed)
 Addended by: CLAUDENE TOBIAS PARAS on: 10/06/2024 09:05 AM   Modules accepted: Orders

## 2024-10-31 DIAGNOSIS — F4322 Adjustment disorder with anxiety: Secondary | ICD-10-CM | POA: Diagnosis not present

## 2024-11-01 ENCOUNTER — Other Ambulatory Visit: Payer: Self-pay

## 2024-11-01 ENCOUNTER — Encounter: Payer: Self-pay | Admitting: Internal Medicine

## 2024-11-01 MED ORDER — FLUTICASONE PROPIONATE 50 MCG/ACT NA SUSP: 2.0000 | Freq: Every day | NASAL | 6 refills | Status: AC

## 2024-11-01 MED ORDER — AMPHETAMINE-DEXTROAMPHET ER 20 MG PO CP24: 20.0000 mg | ORAL_CAPSULE | Freq: Every morning | ORAL | 0 refills | Status: DC

## 2024-11-01 MED ORDER — AMPHETAMINE-DEXTROAMPHETAMINE 5 MG PO TABS: 1.0000 | ORAL_TABLET | Freq: Every day | ORAL | 0 refills | Status: DC | PRN

## 2024-12-05 ENCOUNTER — Encounter: Payer: Self-pay | Admitting: Internal Medicine

## 2024-12-05 MED ORDER — AMPHETAMINE-DEXTROAMPHETAMINE 5 MG PO TABS: 1.0000 | ORAL_TABLET | Freq: Every day | ORAL | 0 refills | Status: DC | PRN

## 2024-12-05 MED ORDER — AMPHETAMINE-DEXTROAMPHET ER 20 MG PO CP24: 20.0000 mg | ORAL_CAPSULE | Freq: Every morning | ORAL | 0 refills | Status: DC

## 2024-12-06 ENCOUNTER — Other Ambulatory Visit (HOSPITAL_COMMUNITY): Payer: Self-pay

## 2024-12-06 ENCOUNTER — Telehealth: Payer: Self-pay

## 2024-12-06 NOTE — Telephone Encounter (Signed)
 Pharmacy Patient Advocate Encounter   Received notification from CoverMyMeds that prior authorization for Amphetamine -Dextroamphetamine  5mg  tabs is required/requested.   Insurance verification completed.   The patient is insured through [employer].   Per test claim: PA required; PA submitted to above mentioned insurance via Latent Key/confirmation #/EOC [id number] Status is pending

## 2024-12-07 NOTE — Telephone Encounter (Signed)
 Pharmacy Patient Advocate Encounter  Received notification from [employer] that Prior Authorization for Amphetamine -Dextroamphetamine  5mg  tabs has been APPROVED from 12/06/24 to 12/06/25   PA #/Case ID/Reference #: [id number]

## 2025-01-09 ENCOUNTER — Encounter: Payer: Self-pay | Admitting: Internal Medicine

## 2025-01-09 MED ORDER — AMPHETAMINE-DEXTROAMPHET ER 20 MG PO CP24: 20.0000 mg | ORAL_CAPSULE | Freq: Every morning | ORAL | 0 refills | Status: AC

## 2025-01-09 MED ORDER — AMPHETAMINE-DEXTROAMPHETAMINE 5 MG PO TABS: 1.0000 | ORAL_TABLET | Freq: Every day | ORAL | 0 refills | Status: AC | PRN

## 2025-01-10 ENCOUNTER — Other Ambulatory Visit (HOSPITAL_COMMUNITY): Payer: Self-pay

## 2025-01-12 ENCOUNTER — Other Ambulatory Visit (HOSPITAL_COMMUNITY): Payer: Self-pay

## 2025-01-12 ENCOUNTER — Telehealth: Payer: Self-pay

## 2025-01-12 NOTE — Telephone Encounter (Signed)
 Pharmacy Patient Advocate Encounter   Received notification from Physician's Office that prior authorization for Amphetamine -Dextroamphet Er 20mg  er capsules is required/requested.   Insurance verification completed.   The patient is insured through [employer].   Per test claim: The current 30 day co-pay is, $33.06.  No PA needed at this time. This test claim was processed through [facility]- copay amounts may vary at other pharmacies due to pharmacy/plan contracts, or as the patient moves through the different stages of their insurance plan.

## 2025-04-03 ENCOUNTER — Ambulatory Visit: Admitting: Internal Medicine
